# Patient Record
Sex: Male | Born: 1965 | Race: Black or African American | Hispanic: No | Marital: Single | Smoking: Current every day smoker
Health system: Southern US, Community
[De-identification: ages and names within clinical notes are randomized; demographics above are authoritative.]

## PROBLEM LIST (undated history)

## (undated) DIAGNOSIS — I639 Cerebral infarction, unspecified: Secondary | ICD-10-CM

## (undated) HISTORY — PX: APPENDECTOMY: SHX54

---

## 2014-10-23 ENCOUNTER — Encounter (HOSPITAL_BASED_OUTPATIENT_CLINIC_OR_DEPARTMENT_OTHER): Payer: Self-pay

## 2014-10-23 ENCOUNTER — Emergency Department (HOSPITAL_BASED_OUTPATIENT_CLINIC_OR_DEPARTMENT_OTHER): Payer: Self-pay

## 2014-10-23 ENCOUNTER — Emergency Department (HOSPITAL_BASED_OUTPATIENT_CLINIC_OR_DEPARTMENT_OTHER)
Admission: EM | Admit: 2014-10-23 | Discharge: 2014-10-23 | Disposition: A | Discharge status: Home / Self Care | Payer: Self-pay | Attending: Emergency Medicine | Admitting: Emergency Medicine

## 2014-10-23 DIAGNOSIS — M546 Pain in thoracic spine: Secondary | ICD-10-CM | POA: Insufficient documentation

## 2014-10-23 DIAGNOSIS — Z720 Tobacco use: Secondary | ICD-10-CM | POA: Insufficient documentation

## 2014-10-23 DIAGNOSIS — H538 Other visual disturbances: Secondary | ICD-10-CM | POA: Insufficient documentation

## 2014-10-23 LAB — COMPREHENSIVE METABOLIC PANEL
ALK PHOS: 76 U/L (ref 39–117)
ALT: 15 U/L (ref 0–53)
ANION GAP: 10 (ref 5–15)
AST: 21 U/L (ref 0–37)
Albumin: 3.8 g/dL (ref 3.5–5.2)
BUN: 13 mg/dL (ref 6–23)
CO2: 27 meq/L (ref 19–32)
Calcium: 9.6 mg/dL (ref 8.4–10.5)
Chloride: 105 mEq/L (ref 96–112)
Creatinine, Ser: 0.9 mg/dL (ref 0.50–1.35)
GLUCOSE: 82 mg/dL (ref 70–99)
POTASSIUM: 4.9 meq/L (ref 3.7–5.3)
SODIUM: 142 meq/L (ref 137–147)
TOTAL PROTEIN: 8.1 g/dL (ref 6.0–8.3)
Total Bilirubin: 0.7 mg/dL (ref 0.3–1.2)

## 2014-10-23 LAB — CBC WITH DIFFERENTIAL/PLATELET
BASOS PCT: 1 % (ref 0–1)
Basophils Absolute: 0.1 10*3/uL (ref 0.0–0.1)
Eosinophils Absolute: 0.6 10*3/uL (ref 0.0–0.7)
Eosinophils Relative: 7 % — ABNORMAL HIGH (ref 0–5)
HCT: 42.8 % (ref 39.0–52.0)
HEMOGLOBIN: 14.6 g/dL (ref 13.0–17.0)
Lymphocytes Relative: 16 % (ref 12–46)
Lymphs Abs: 1.3 10*3/uL (ref 0.7–4.0)
MCH: 31.3 pg (ref 26.0–34.0)
MCHC: 34.1 g/dL (ref 30.0–36.0)
MCV: 91.6 fL (ref 78.0–100.0)
MONOS PCT: 9 % (ref 3–12)
Monocytes Absolute: 0.7 10*3/uL (ref 0.1–1.0)
NEUTROS PCT: 67 % (ref 43–77)
Neutro Abs: 5.8 10*3/uL (ref 1.7–7.7)
PLATELETS: 199 10*3/uL (ref 150–400)
RBC: 4.67 MIL/uL (ref 4.22–5.81)
RDW: 13 % (ref 11.5–15.5)
WBC: 8.6 10*3/uL (ref 4.0–10.5)

## 2014-10-23 NOTE — ED Provider Notes (Signed)
CSN: [id number]     Arrival date & time 10/23/14  0736 History   First MD Initiated Contact with Patient 10/23/14 0755     Chief Complaint  Patient presents with  . Blurred Vision     (Consider location/radiation/quality/duration/timing/severity/associated sxs/prior Treatment) HPI Comments: Patient is a 48 year old male with no significant past medical history. He presents with complaints of a 10 day history of intermittent blurry vision in his right eye. He denies any injury or trauma. He denies any aggravating or alleviating factors. He states that this comes and goes at random. When he experiences this blurry vision, this is sometimes associated with pains in his left shoulder that radiated down his left arm and occasionally in his left leg. He denies any low back pain and denies any neck pain. 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  The history is provided by the patient.    History reviewed. No pertinent past medical history. Past Surgical History  Procedure Laterality Date  . Appendectomy     History reviewed. No pertinent family history. History  Substance Use Topics  . Smoking status: Current Every Day Smoker -- 0.50 packs/day    Types: Cigarettes  . Smokeless tobacco: Never Used  . Alcohol Use: 4.2 oz/week    7 Cans of beer per week     Comment: Pt stated that he drinks a beer per day    Review of Systems  All other systems reviewed and are negative.     Allergies  Review of patient's allergies indicates no known allergies.  Home Medications   Prior to Admission medications   Not on File   BP 112/69 mmHg  Pulse 74  Temp(Src) 98 F (36.7 C) (Oral)  Resp 20  Ht 5\' 11"  (1.803 m)  Wt 145 lb (65.772 kg)  BMI 20.23 kg/m2  SpO2 100% Physical Exam  Constitutional: He is oriented to person, place, and time. He appears well-developed and well-nourished. No distress.  HENT:  Head: Normocephalic and atraumatic.  Eyes: EOM are normal.  Pupils are equal, round, and reactive to light.  The right eye appears grossly normal. The cornea is clear and anterior chambers clear as well. Funduscopic exam reveals no papilledema.  Neck: Normal range of motion. Neck supple.  Cardiovascular: Normal rate, regular rhythm and normal heart sounds.   No murmur heard. Pulmonary/Chest: Effort normal and breath sounds normal. No respiratory distress. He has no wheezes.  Abdominal: Soft. Bowel sounds are normal. He exhibits no distension. There is no tenderness.  Musculoskeletal: Normal range of motion. He exhibits no edema.  Neurological: He is alert and oriented to person, place, and time. No cranial nerve deficit. He exhibits normal muscle tone. Coordination normal.  Skin: Skin is warm and dry. He is not diaphoretic.  Nursing note and vitals reviewed.   ED Course  Procedures (including critical care time) Labs Review Labs Reviewed  COMPREHENSIVE METABOLIC PANEL  CBC WITH DIFFERENTIAL    Imaging Review No results found.   EKG Interpretation None      MDM   Final diagnoses:  None    Patient is a 48 year old male who presents with complaints of blurry vision in his right eye and pain in his left shoulder and back. These appear unrelated. His eye exam is unremarkable and CT scan of the head does not reveal any intracranial pathology. His visual acuity is diminished in both eyes, but the right eye to a greater degree. I feel as though he requires  follow-up with optometry to have his eyes examined. As far as the pain in his back goes, nothing appears emergent. There are no neurologic deficits and no bowel or bladder complaints. I feel as though he is appropriate for discharge.    Geoffery Lyonsouglas Melo Stauber, MD 10/23/14 [phone]

## 2014-10-23 NOTE — ED Notes (Signed)
Pt presented to the ED with blurred vision in the right eye. Pt stated that it started 10 days ago and that he had been treating it with eye drops and thought it was allergies. Pt stated also complaining of left side pain underneath his arm pit and some back pain. Pt stated that he has sharp pains run down the back of his left leg at times.

## 2014-10-23 NOTE — Discharge Instructions (Signed)
Ibuprofen 600 mg every 6 hours as needed for pain.  Follow-up with optometry for an eye exam.  Return to the emergency department if your symptoms substantially worsen or change.   Back Pain, Adult Low back pain is very common. About 1 in 5 people have back pain.The cause of low back pain is rarely dangerous. The pain often gets better over time.About half of people with a sudden onset of back pain feel better in just 2 weeks. About 8 in 10 people feel better by 6 weeks.  CAUSES Some common causes of back pain include:  Strain of the muscles or ligaments supporting the spine.  Wear and tear (degeneration) of the spinal discs.  Arthritis.  Direct injury to the back. DIAGNOSIS Most of the time, the direct cause of low back pain is not known.However, back pain can be treated effectively even when the exact cause of the pain is unknown.Answering your caregiver's questions about your overall health and symptoms is one of the most accurate ways to make sure the cause of your pain is not dangerous. If your caregiver needs more information, he or she may order lab work or imaging tests (X-rays or MRIs).However, even if imaging tests show changes in your back, this usually does not require surgery. HOME CARE INSTRUCTIONS For many people, back pain returns.Since low back pain is rarely dangerous, it is often a condition that people can learn to [facility] Districtmanageon their own.   Remain active. It is stressful on the back to sit or stand in one place. Do not sit, drive, or stand in one place for more than 30 minutes at a time. Take short walks on level surfaces as soon as pain allows.Try to increase the length of time you walk each day.  Do not stay in bed.Resting more than 1 or 2 days can delay your recovery.  Do not avoid exercise or work.Your body is made to move.It is not dangerous to be active, even though your back may hurt.Your back will likely heal faster if you return to being active before  your pain is gone.  Pay attention to your body when you bend and lift. Many people have less discomfortwhen lifting if they bend their knees, keep the load close to their bodies,and avoid twisting. Often, the most comfortable positions are those that put less stress on your recovering back.  Find a comfortable position to sleep. Use a firm mattress and lie on your side with your knees slightly bent. If you lie on your back, put a pillow under your knees.  Only take over-the-counter or prescription medicines as directed by your caregiver. Over-the-counter medicines to reduce pain and inflammation are often the most helpful.Your caregiver may prescribe muscle relaxant drugs.These medicines help dull your pain so you can more quickly return to your normal activities and healthy exercise.  Put ice on the injured area.  Put ice in a plastic bag.  Place a towel between your skin and the bag.  Leave the ice on for 15-20 minutes, 03-04 times a day for the first 2 to 3 days. After that, ice and heat may be alternated to reduce pain and spasms.  Ask your caregiver about trying back exercises and gentle massage. This may be of some benefit.  Avoid feeling anxious or stressed.Stress increases muscle tension and can worsen back pain.It is important to recognize when you are anxious or stressed and learn ways to manage it.Exercise is a great option. SEEK MEDICAL CARE IF:  You have  pain that is not relieved with rest or medicine.  You have pain that does not improve in 1 week.  You have new symptoms.  You are generally not feeling well. SEEK IMMEDIATE MEDICAL CARE IF:   You have pain that radiates from your back into your legs.  You develop new bowel or bladder control problems.  You have unusual weakness or numbness in your arms or legs.  You develop nausea or vomiting.  You develop abdominal pain.  You feel faint. Document Released: 12/07/2005 Document Revised: 06/07/2012 Document  Reviewed: 04/10/2014 [facility] Patient Information 2015 [place], [place]. This information is not intended to replace advice given to you by your health care provider. Make sure you discuss any questions you have with your health care provider.  Visual Disturbances You have had a disturbance in your vision. This may be caused by various conditions, such as:  Migraines. Migraine headaches are often preceded by a disturbance in vision. Blind spots or light flashes are followed by a headache. This type of visual disturbance is temporary. It does not damage the eye.  Glaucoma. This is caused by increased pressure in the eye. Symptoms include haziness, blurred vision, or seeing rainbow colored circles when looking at bright lights. Partial or complete visual loss can occur. You may or may not experience eye pain. Visual loss may be gradual or sudden and is irreversible. Glaucoma is the leading cause of blindness.  Retina problems. Vision will be reduced if the retina becomes detached or if there is a circulation problem as with diabetes, high blood pressure, or a mini-stroke. Symptoms include seeing "floaters," flashes of light, or shadows, as if a curtain has fallen over your eye.  Optic nerve problems. The main nerve in your eye can be damaged by redness, soreness, and swelling (inflammation), poor circulation, drugs, and toxins. It is very important to have a complete exam done by a specialist to determine the exact cause of your eye problem. The specialist may recommend medicines or surgery, depending on the cause of the problem. This can help prevent further loss of vision or reduce the risk of having a stroke. Contact the caregiver to whom you have been referred and arrange for follow-up care right away. SEEK IMMEDIATE MEDICAL CARE IF:   Your vision gets worse.  You develop severe headaches.  You have any weakness or numbness in the face, arms, or legs.  You have any trouble speaking or  walking. Document Released: 01/14/2005 Document Revised: 02/29/2012 Document Reviewed: 05/07/2010 [facility] OrleansExitCare Patient Information 2015 [place], [place]. This information is not intended to replace advice given to you by your health care provider. Make sure you discuss any questions you have with your health care provider.

## 2014-10-23 NOTE — ED Notes (Signed)
MD at bedside. 

## 2014-10-23 NOTE — ED Notes (Signed)
Patient denies pain and is resting comfortably.  

## 2016-03-29 ENCOUNTER — Emergency Department (HOSPITAL_BASED_OUTPATIENT_CLINIC_OR_DEPARTMENT_OTHER): Payer: Self-pay

## 2016-03-29 ENCOUNTER — Encounter (HOSPITAL_BASED_OUTPATIENT_CLINIC_OR_DEPARTMENT_OTHER): Payer: Self-pay | Admitting: Emergency Medicine

## 2016-03-29 ENCOUNTER — Inpatient Hospital Stay (HOSPITAL_COMMUNITY): Payer: Self-pay

## 2016-03-29 ENCOUNTER — Observation Stay (HOSPITAL_BASED_OUTPATIENT_CLINIC_OR_DEPARTMENT_OTHER)
Admission: EM | Admit: 2016-03-29 | Discharge: 2016-03-31 | Disposition: A | Discharge status: Home / Self Care | Payer: Self-pay | Attending: Internal Medicine | Admitting: Internal Medicine

## 2016-03-29 DIAGNOSIS — G459 Transient cerebral ischemic attack, unspecified: Secondary | ICD-10-CM

## 2016-03-29 DIAGNOSIS — F17200 Nicotine dependence, unspecified, uncomplicated: Secondary | ICD-10-CM | POA: Insufficient documentation

## 2016-03-29 DIAGNOSIS — F449 Dissociative and conversion disorder, unspecified: Principal | ICD-10-CM | POA: Insufficient documentation

## 2016-03-29 DIAGNOSIS — F1210 Cannabis abuse, uncomplicated: Secondary | ICD-10-CM | POA: Insufficient documentation

## 2016-03-29 DIAGNOSIS — R519 Headache, unspecified: Secondary | ICD-10-CM

## 2016-03-29 DIAGNOSIS — I639 Cerebral infarction, unspecified: Secondary | ICD-10-CM | POA: Insufficient documentation

## 2016-03-29 DIAGNOSIS — R531 Weakness: Secondary | ICD-10-CM

## 2016-03-29 DIAGNOSIS — R51 Headache: Secondary | ICD-10-CM | POA: Insufficient documentation

## 2016-03-29 DIAGNOSIS — R200 Anesthesia of skin: Secondary | ICD-10-CM | POA: Insufficient documentation

## 2016-03-29 DIAGNOSIS — F17210 Nicotine dependence, cigarettes, uncomplicated: Secondary | ICD-10-CM | POA: Insufficient documentation

## 2016-03-29 DIAGNOSIS — F419 Anxiety disorder, unspecified: Secondary | ICD-10-CM | POA: Insufficient documentation

## 2016-03-29 LAB — COMPREHENSIVE METABOLIC PANEL
ALT: 18 U/L (ref 17–63)
ANION GAP: 6 (ref 5–15)
AST: 24 U/L (ref 15–41)
Albumin: 4.1 g/dL (ref 3.5–5.0)
Alkaline Phosphatase: 59 U/L (ref 38–126)
BUN: 14 mg/dL (ref 6–20)
CALCIUM: 9.2 mg/dL (ref 8.9–10.3)
CO2: 25 mmol/L (ref 22–32)
Chloride: 103 mmol/L (ref 101–111)
Creatinine, Ser: 1 mg/dL (ref 0.61–1.24)
GFR calc Af Amer: >60 mL/min (ref >60)
GFR calc non Af Amer: >60 mL/min (ref >60)
Glucose, Bld: 129 mg/dL — ABNORMAL HIGH (ref 65–99)
Potassium: 3.5 mmol/L (ref 3.5–5.1)
SODIUM: 134 mmol/L — AB (ref 135–145)
Total Bilirubin: 1.7 mg/dL — ABNORMAL HIGH (ref 0.3–1.2)
Total Protein: 7.8 g/dL (ref 6.5–8.1)

## 2016-03-29 LAB — RAPID URINE DRUG SCREEN, HOSP PERFORMED
AMPHETAMINES: NOT DETECTED
BARBITURATES: NOT DETECTED
Benzodiazepines: NOT DETECTED
COCAINE: NOT DETECTED
Opiates: NOT DETECTED
Tetrahydrocannabinol: POSITIVE — AB

## 2016-03-29 LAB — URINALYSIS, ROUTINE W REFLEX MICROSCOPIC
BILIRUBIN URINE: NEGATIVE
GLUCOSE, UA: NEGATIVE mg/dL
HGB URINE DIPSTICK: NEGATIVE
Ketones, ur: 15 mg/dL — AB
Leukocytes, UA: NEGATIVE
Nitrite: NEGATIVE
PROTEIN: NEGATIVE mg/dL
Specific Gravity, Urine: 1.022 (ref 1.005–1.030)
pH: 6 (ref 5.0–8.0)

## 2016-03-29 LAB — CBC
HCT: 40.6 % (ref 39.0–52.0)
HEMOGLOBIN: 14.1 g/dL (ref 13.0–17.0)
MCH: 31 pg (ref 26.0–34.0)
MCHC: 34.7 g/dL (ref 30.0–36.0)
MCV: 89.2 fL (ref 78.0–100.0)
PLATELETS: 197 10*3/uL (ref 150–400)
RBC: 4.55 MIL/uL (ref 4.22–5.81)
RDW: 13.2 % (ref 11.5–15.5)
WBC: 8.6 10*3/uL (ref 4.0–10.5)

## 2016-03-29 LAB — CBG MONITORING, ED: GLUCOSE-CAPILLARY: 149 mg/dL — AB (ref 65–99)

## 2016-03-29 LAB — DIFFERENTIAL
Basophils Absolute: 0.1 10*3/uL (ref 0.0–0.1)
Basophils Relative: 1 %
EOS PCT: 5 %
Eosinophils Absolute: 0.5 10*3/uL (ref 0.0–0.7)
LYMPHS PCT: 21 %
Lymphs Abs: 1.8 10*3/uL (ref 0.7–4.0)
Monocytes Absolute: 0.9 10*3/uL (ref 0.1–1.0)
Monocytes Relative: 11 %
NEUTROS PCT: 62 %
Neutro Abs: 5.4 10*3/uL (ref 1.7–7.7)

## 2016-03-29 LAB — PROTIME-INR
INR: 1.04 (ref 0.00–1.49)
Prothrombin Time: 13.8 seconds (ref 11.6–15.2)

## 2016-03-29 LAB — ETHANOL: Alcohol, Ethyl (B): <5 mg/dL (ref <5)

## 2016-03-29 LAB — APTT: APTT: 28 s (ref 24–37)

## 2016-03-29 LAB — TROPONIN I: Troponin I: <0.03 ng/mL (ref <0.031)

## 2016-03-29 MED ORDER — SODIUM CHLORIDE 0.9 % IV SOLN
1000.0000 mL | Freq: Once | INTRAVENOUS | Status: AC
  Administered 2016-03-29: 1000 mL via INTRAVENOUS

## 2016-03-29 MED ORDER — ROSUVASTATIN CALCIUM 40 MG PO TABS
40.0000 mg | ORAL_TABLET | Freq: Every day | ORAL | Status: DC
  Administered 2016-03-29 – 2016-03-30 (×2): 40 mg via ORAL
  Filled 2016-03-29 (×2): qty 1

## 2016-03-29 MED ORDER — SODIUM CHLORIDE 0.9 % IV BOLUS (SEPSIS)
1000.0000 mL | Freq: Once | INTRAVENOUS | Status: AC
Start: 2016-03-29 — End: 2016-03-29
  Administered 2016-03-29: 1000 mL via INTRAVENOUS

## 2016-03-29 MED ORDER — STROKE: EARLY STAGES OF RECOVERY BOOK
Freq: Once | Status: AC
  Administered 2016-03-30
  Filled 2016-03-29: qty 1

## 2016-03-29 MED ORDER — SODIUM CHLORIDE 0.9 % IV SOLN: 1000.0000 mL | INTRAVENOUS | Status: DC

## 2016-03-29 MED ORDER — ASPIRIN 81 MG PO CHEW
324.0000 mg | CHEWABLE_TABLET | Freq: Once | ORAL | Status: AC
  Administered 2016-03-29: 324 mg via ORAL
  Filled 2016-03-29: qty 4

## 2016-03-29 MED ORDER — SENNOSIDES-DOCUSATE SODIUM 8.6-50 MG PO TABS: 1.0000 | ORAL_TABLET | Freq: Every evening | ORAL | Status: DC | PRN

## 2016-03-29 MED ORDER — IOPAMIDOL (ISOVUE-370) INJECTION 76%
INTRAVENOUS | Status: AC
  Administered 2016-03-29: 50 mL
  Filled 2016-03-29: qty 50

## 2016-03-29 MED ORDER — SODIUM CHLORIDE 0.9 % IV SOLN
INTRAVENOUS | Status: AC
  Administered 2016-03-29: 21:00:00 via INTRAVENOUS

## 2016-03-29 NOTE — ED Notes (Signed)
Patient transported to CT 

## 2016-03-29 NOTE — ED Notes (Signed)
MD at bedside. 

## 2016-03-29 NOTE — Progress Notes (Signed)
[facility], alert and oriented with c/o of slight headache, pt settled in bed with call light at bedside, admitting doc paged and notified of pt's arrival, pt reassured, will however continue to monitor, v/s stable. Obasogie-Asidi, Dezyrae Kensinger Efe

## 2016-03-29 NOTE — ED Provider Notes (Signed)
CSN: [id number]     Arrival date & time 03/29/16  1716 History  By signing my name below, I, [facility]Marrissa Washington, attest that this documentation has been prepared under the direction and in the presence of Arby BarretteMarcy Cam Harnden, MD. Electronically Signed: Randell PatientMarrissa Washington, ED Scribe. 03/29/2016. 12:31 AM.   Chief Complaint  Patient presents with  . Numbness   The history is provided by the patient. No language interpreter was used.   HPI Comments: Georgiana SpinnerRobin L Roots is a 50 y.o. male who presents to the Emergency Department complaining of constant, gradually worsening numbness to his left arm, left leg, and left side of his face onset this morning upon waking. Patient reports numbness in the back and top of his head and in his left shoulder and forearm that began 11 hours ago. He states that he had difficulty standing but went back to sleep. Upon waking, 8.5 hours ago numbness had worsened with blurred vision in his left eye. He notes HA and speech slurring. Per patient, he has a family hx of stroke and had a brother who recently passed away from a massive stroke. He is a current 0.5 ppd cigarette smoker and consumes ETOH twice a week. Denies hx of HTN, DM. Denies recent illnesses, injuries, and falls. Denies fevers, chills.  History reviewed. No pertinent past medical history. Past Surgical History  Procedure Laterality Date  . Appendectomy     History reviewed. No pertinent family history. Social History  Substance Use Topics  . Smoking status: Current Every Day Smoker -- 0.50 packs/day    Types: Cigarettes  . Smokeless tobacco: Never Used  . Alcohol Use: 4.2 oz/week    7 Cans of beer per week     Comment: Pt stated that he drinks a beer per day    Review of Systems A complete 10 system review of systems was obtained and all systems are negative except as noted in the HPI and PMH.   Allergies  Review of patient's allergies indicates no known allergies.  Home Medications   Prior to  Admission medications   Not on File   BP 112/76 mmHg  Pulse 55  Temp(Src) 98.2 F (36.8 C) (Oral)  Resp 18  Ht 5\' 11"  (1.803 m)  Wt 143 lb 14.4 oz (65.273 kg)  BMI 20.08 kg/m2  SpO2 100% Physical Exam  Constitutional: He is oriented to person, place, and time. He appears well-developed and well-nourished. No distress.  HENT:  Head: Normocephalic and atraumatic.  Eyes: Conjunctivae and EOM are normal.  EOM intact. No visual field cuts.  Cardiovascular: Normal rate, regular rhythm and normal heart sounds.  Exam reveals no gallop and no friction rub.   No murmur heard. Pulmonary/Chest: Effort normal. No respiratory distress.  Abdominal: Soft.  Musculoskeletal: Normal range of motion.  No peripheral edema.  Neurological: He is alert and oriented to person, place, and time.  No cranial nerve deficit. 5/5 motor strength in BUE. No pronator drift. Endorses sensory differences to light touch, left arm vs right. Left lower extremity 4/5 strength. Right lower extremity 5/5 strength.  Skin: Skin is warm and dry.  Psychiatric: He has a normal mood and affect. His behavior is normal.    ED Course  Procedures   DIAGNOSTIC STUDIES: Oxygen Saturation is 98% on RA, normal by my interpretation.    COORDINATION OF CARE: 5:33 PM Will order labs, EKG, IV fluids, head CT. Discussed treatment plan with pt at bedside and pt agreed to plan.  6:32 PM  Discussed results of labs and CT imaging. Will consult with neurology. Will transfer pt to [facility] to be seen by neurologist and for further evaluation and testing.  Labs Review Labs Reviewed  COMPREHENSIVE METABOLIC PANEL - Abnormal; Notable for the following:    Sodium 134 (*)    Glucose, Bld 129 (*)    Total Bilirubin 1.7 (*)    All other components within normal limits  URINE RAPID DRUG SCREEN, HOSP PERFORMED - Abnormal; Notable for the following:    Tetrahydrocannabinol POSITIVE (*)    All other components within normal limits   URINALYSIS, ROUTINE W REFLEX MICROSCOPIC (NOT AT [facility]) - Abnormal; Notable for the following:    Ketones, ur 15 (*)    All other components within normal limits  CBG MONITORING, ED - Abnormal; Notable for the following:    Glucose-Capillary 149 (*)    All other components within normal limits  ETHANOL  PROTIME-INR  APTT  CBC  DIFFERENTIAL  TROPONIN I  HEMOGLOBIN A1C  COMPREHENSIVE METABOLIC PANEL  LIPID PANEL    Imaging Review  I have personally reviewed and evaluated these images and lab results as part of my medical decision-making.   EKG Interpretation   Date/Time:  Sunday March 29 2016 17:45:58 EDT Ventricular Rate:  75 PR Interval:  139 QRS Duration: 88 QT Interval:  377 QTC Calculation: 421 R Axis:   83 Text Interpretation:  Sinus rhythm RSR' in V1 or V2, probably normal  variant Probable anterolateral infarct, old ST elevation, consider  inferior injury diffuse ST elevation c/w early repolarizarion Confirmed by  Donnald Garre, MD, Lebron Conners [phone]) on 03/29/2016 6:36:43 PM     Consult:(18:45) Reviews neurology, Dr. Lavon Paganini. Advises to give aspirin and fluids. He has reviewed CT and feels there are signs of CVA on CT despite radiology review. Advises to admit the patient to hospitalist service and transferred to [facility] for ongoing stroke workup. MDM   Final diagnoses:  Acute CVA (cerebrovascular accident) (HCC)    I reviewed the patient's case with Dr. Lavon Paganini. He also feels a CT shows potential infarct type anomaly. Patient will be transferred to [facility] for ongoing stroke workup. Symptoms onset was upon awakening this morning and he is outside of any timeframe for TPA. He is otherwise alert and nontoxic. He does not have headache. At this timesuspicion for infectious etiology or other indolent process.  Arby Barrette, MD 03/30/16 [phone]

## 2016-03-29 NOTE — Progress Notes (Signed)
Patient is a 50 yo male with history of CVA who has been having worsening numbness in left arm since this morning. ER physician talked to Neuro and they wanted to admit for TIA/Stroke workup. Patient should get CTA and neurology contacted upon arrival.

## 2016-03-29 NOTE — H&P (Signed)
History and Physical  Matthew Thomas ZOX:[id number] DOB: 16-Jan-1966 DOA: 03/29/2016  PCP: No PCP Per Patient   Chief Complaint: left side numbness/weakness   History of Present Illness:  Patient is a 50 yo left handed male with no significant past medical history who came to the ED of an outside facility complaining of left side numbness that started earlier this morning around 8 am and progressed to his left face/head/leg then progressed to be associated with weakness in his left side that he was unable to get himself up : all within a few hours. He also had some difficulty expressing himself/finding words (expressive aphasia). Symptoms have improved only very mildly after he went to the ER and until now.   Review of Systems:  CONSTITUTIONAL:  No night sweats.  No fatigue, malaise, lethargy.  No fever or chills. Eyes:  No visual changes.  No eye pain.  No eye discharge.   ENT:    No epistaxis.  No sinus pain.  No sore throat.  No ear pain.  No congestion. RESPIRATORY:  No cough.  No wheeze.  No hemoptysis.  No shortness of breath. CARDIOVASCULAR:  No chest pains.  No palpitations. GASTROINTESTINAL:  No abdominal pain.  No nausea or vomiting.  No diarrhea or constipation.  No hematemesis.  No hematochezia.  No melena. GENITOURINARY:  No urgency.  No frequency.  No dysuria.  No hematuria.  No obstructive symptoms.  No discharge.  No pain.  No significant abnormal bleeding. MUSCULOSKELETAL:  No musculoskeletal pain.  No joint swelling.  No arthritis. NEUROLOGICAL:  No confusion.  +weakness. +headache. No seizure. PSYCHIATRIC:  No depression. No anxiety. No suicidal ideation. SKIN:  No rashes.  No lesions.  No wounds. ENDOCRINE:  No unexplained weight loss.  No polydipsia.  No polyuria.  No polyphagia. HEMATOLOGIC:  No anemia.  No purpura.  No petechiae.  No bleeding.  ALLERGIC AND IMMUNOLOGIC:  No pruritus.  No swelling Other:  Past Medical and Surgical History:   History reviewed.  No pertinent past medical history. Past Surgical History  Procedure Laterality Date  . Appendectomy      Social History:   reports that he has been smoking Cigarettes.  He has been smoking about 0.50 packs per day. He has never used smokeless tobacco. He reports that he drinks about 4.2 oz of alcohol per week. He reports that he uses illicit drugs (Marijuana).   No Known Allergies  FH: significant for massive stroke causing his brother death at age of 47 ( he had DM/HTN too), CHF in his mom.   Prior to Admission medications   Not on File    Physical Exam: BP 125/80 mmHg  Pulse 66  Temp(Src) 98.2 F (36.8 C) (Oral)  Resp 18  Ht  (1.803 m)  Wt 65.273 kg (143 lb 14.4 oz)  BMI 20.08 kg/m2  SpO2 97%  GENERAL : Well developed, well nourished, alert and cooperative, and appears to be in no acute distress. HEAD: normocephalic. EYES: PERRL, EOMI.  EARS:  hearing grossly intact. NOSE: No nasal discharge. THROAT: Oral cavity and pharynx normal.  NECK: Neck supple CARDIAC: Normal S1 and S2. No S3, S4 or murmurs. Rhythm is regular. There is no peripheral edema. LUNGS: Clear to auscultation ABDOMEN: Positive bowel sounds. Soft, nondistended, nontender.  EXTREMITIES: No significant deformity or joint abnormality.  NEUROLOGICAL: The mental examination revealed the patient was oriented to person, place, and time.CN II-XII intact except : numbness in left face, difficulty rotating head  to left side XI, no facial droop. Strength and sensation asymmetric: 5/5 in right side : 1/5 in left side : can't raise it up or move against resistance: can only move it parallel to a surface. Cerebellar testing normal in right hand; could not assess it in left side. Decreased sensation in left side.  SKIN: Skin normal color PSYCHIATRIC:  The patient was able to demonstrate good judgement and reason, without hallucinations, abnormal affect or abnormal behaviors during the examination. Patient is not  suicidal.          Labs on Admission:  Reviewed.   Radiological Exams on Admission: Ct Head Wo Contrast  03/29/2016  CLINICAL DATA:  Weakness and tingling EXAM: CT HEAD WITHOUT CONTRAST TECHNIQUE: Contiguous axial images were obtained from the base of the skull through the vertex without intravenous contrast. COMPARISON:  10/23/2014 FINDINGS: No acute cortical infarct, hemorrhage, or mass lesion ispresent. Ventricles are of normal size. No significant extra-axial fluid collection is present. Chronic mucoperiosteal thickening involving the maxillary sinuses noted. There is also mucosal thickening involving the ethmoid air cells and the frontal sinuses. The calvarium appears intact. The osseous skull is intact. IMPRESSION: 1. No acute intracranial abnormalities. 2. Chronic sinus disease. Electronically Signed   By: Signa Kellaylor  Stroud M.D.   On: 03/29/2016 18:06     Assessment/Plan  Stroke/TIA:  MRI: ordered   MRA :ordered   2D Echo : ordered   Telemetry for 24 hours post stroke/TIA.  Lipid profile in am  HgbA1c in am  UDS : pos for tetrahydrocannabinol  VTE prophylaxisSCDs awaiting neuro recs.   Antiplatelet therapy:  Got asp 324 mg in ER: awaiting further neuro recs.   Statin: crestor 40 mg ordered.    Neurochecks Q2H, PT/OT/Speech therapy, Neurology consult.   Bedside swallow evaluation: passed: will start on a cardiac diet. aspiration precautions.  ZO:[id number]:will be monitored with permissive elevation up to 220/110 Awaiting neuro eval.    DVT prophylaxis: SCD Consultants: Neuro Code Status: Full      Eston EstersAhmad Kaylor Simenson M.D [facility]

## 2016-03-29 NOTE — ED Notes (Signed)
C/C left arm numbness and weakness. Pt states he went to bed 11pm last night feeling fine. Woke up at 6am felt 'tingling in the brain' had difficulty with standing from left arm and left leg numbness. Symptoms continued, he went back to sleep. Woke up at 9am with increase in numbness to left arm/left leg and then developed blurred vision to left eye.

## 2016-03-29 NOTE — Consult Note (Signed)
Admission H&P    Chief Complaint: New onset numbness and speech changes.  HPI: Matthew Thomas is an 50 y.o. male who was seen initially at [facility] and subsequently transferred to [facility] for further evaluation. He developed acute onset of numbness involving left arm and shoulder with subsequent spread to left side of his face at about 8 AM this morning. He also had speech output difficulty. He has no previous history of stroke nor TIA. He has not been on antiplatelet therapy. CT scan of his head showed no acute intracranial abnormality. MRI of the brain also showed no acute intracranial abnormality. Speech difficulty has resolved. He still has residual left upper extremity and left facial numbness.  LSN: 8 AM on 03/29/2016 tPA Given: No: Minimal deficits and beyond time window for treatment consideration mRankin:  History reviewed. No pertinent past medical history.  Past Surgical History  Procedure Laterality Date  . Appendectomy      History reviewed. No pertinent family history. Social History:  reports that he has been smoking Cigarettes.  He has been smoking about 0.50 packs per day. He has never used smokeless tobacco. He reports that he drinks about 4.2 oz of alcohol per week. He reports that he uses illicit drugs (Marijuana).  Allergies: No Known Allergies  No prescriptions prior to admission    ROS: History obtained from the patient  General ROS: negative for - chills, fatigue, fever, night sweats, weight gain or weight loss Psychological ROS: negative for - behavioral disorder, hallucinations, memory difficulties, mood swings or suicidal ideation Ophthalmic ROS: negative for - blurry vision, double vision, eye pain or loss of vision ENT ROS: negative for - epistaxis, nasal discharge, oral lesions, sore throat, tinnitus or vertigo Allergy and Immunology ROS: negative for - hives or itchy/watery eyes Hematological and Lymphatic ROS: negative for - bleeding problems, bruising or  swollen lymph nodes Endocrine ROS: negative for - galactorrhea, hair pattern changes, polydipsia/polyuria or temperature intolerance Respiratory ROS: negative for - cough, hemoptysis, shortness of breath or wheezing Cardiovascular ROS: negative for - chest pain, dyspnea on exertion, edema or irregular heartbeat Gastrointestinal ROS: negative for - abdominal pain, diarrhea, hematemesis, nausea/vomiting or stool incontinence Genito-Urinary ROS: negative for - dysuria, hematuria, incontinence or urinary frequency/urgency Musculoskeletal ROS: negative for - joint swelling or muscular weakness Neurological ROS: as noted in HPI Dermatological ROS: negative for rash and skin lesion changes  Physical Examination: Blood pressure 123/73, pulse 64, temperature 98.2 F (36.8 C), temperature source Oral, resp. rate 18, height _0  (1.803 m), weight 65.273 kg (143 lb 14.4 oz), SpO2 98 %.  HEENT-  Normocephalic, no lesions, without obvious abnormality.  Normal external eye and conjunctiva.  Normal TM's bilaterally.  Normal auditory canals and external ears. Normal external nose, mucus membranes and septum.  Normal pharynx. Neck supple with no masses, nodes, nodules or enlargement. Cardiovascular - regular rate and rhythm, S1, S2 normal, no murmur, click, rub or gallop Lungs - chest clear, no wheezing, rales, normal symmetric air entry Abdomen - soft, non-tender; bowel sounds normal; no masses,  no organomegaly Extremities - no joint deformities, effusion, or inflammation and no edema  Neurologic Examination: Mental Status: Alert, oriented, thought content appropriate.  Speech fluent without evidence of aphasia. Able to follow commands without difficulty. Cranial Nerves: II-Visual fields were normal. III/IV/VI-Pupils were equal and reacted. Extraocular movements were full and conjugate.    V/VII-reduced perception of tactile sensation of left-sided face compared to the right in the colon no facial  weakness.  VIII-normal. X-normal speech and symmetrical palatal movement. XI: trapezius strength/neck flexion strength normal bilaterally XII-midline tongue extension with normal strength. Motor: 5/5 bilaterally with normal tone and bulk Sensory: Reduced perception of tactile sensation over left upper extremity compared to the right; sensory exam otherwise unremarkable. Deep Tendon Reflexes: 2+ and symmetric. Plantars: Flexor bilaterally Cerebellar: Normal finger-to-nose testing. Carotid auscultation: Normal  Results for orders placed or performed during the hospital encounter of 03/29/16 (from the past 48 hour(s))  Ethanol     Status: None   Collection Time: 03/29/16  5:40 PM  Result Value Ref Range   Alcohol, Ethyl (B) <5 <5 mg/dL    Comment:        LOWEST DETECTABLE LIMIT FOR SERUM ALCOHOL IS 5 mg/dL FOR MEDICAL PURPOSES ONLY   Protime-INR     Status: None   Collection Time: 03/29/16  5:40 PM  Result Value Ref Range   Prothrombin Time 13.8 11.6 - 15.2 seconds   INR 1.04 0.00 - 1.49  APTT     Status: None   Collection Time: 03/29/16  5:40 PM  Result Value Ref Range   aPTT 28 24 - 37 seconds  CBC     Status: None   Collection Time: 03/29/16  5:40 PM  Result Value Ref Range   WBC 8.6 4.0 - 10.5 K/uL   RBC 4.55 4.22 - 5.81 MIL/uL   Hemoglobin 14.1 13.0 - 17.0 g/dL   HCT 40.6 39.0 - 52.0 %   MCV 89.2 78.0 - 100.0 fL   MCH 31.0 26.0 - 34.0 pg   MCHC 34.7 30.0 - 36.0 g/dL   RDW 13.2 11.5 - 15.5 %   Platelets 197 150 - 400 K/uL  Differential     Status: None   Collection Time: 03/29/16  5:40 PM  Result Value Ref Range   Neutrophils Relative % 62 %   Neutro Abs 5.4 1.7 - 7.7 K/uL   Lymphocytes Relative 21 %   Lymphs Abs 1.8 0.7 - 4.0 K/uL   Monocytes Relative 11 %   Monocytes Absolute 0.9 0.1 - 1.0 K/uL   Eosinophils Relative 5 %   Eosinophils Absolute 0.5 0.0 - 0.7 K/uL   Basophils Relative 1 %   Basophils Absolute 0.1 0.0 - 0.1 K/uL  Comprehensive metabolic panel      Status: Abnormal   Collection Time: 03/29/16  5:40 PM  Result Value Ref Range   Sodium 134 (L) 135 - 145 mmol/L   Potassium 3.5 3.5 - 5.1 mmol/L   Chloride 103 101 - 111 mmol/L   CO2 25 22 - 32 mmol/L   Glucose, Bld 129 (H) 65 - 99 mg/dL   BUN 14 6 - 20 mg/dL   Creatinine, Ser 1.00 0.61 - 1.24 mg/dL   Calcium 9.2 8.9 - 10.3 mg/dL   Total Protein 7.8 6.5 - 8.1 g/dL   Albumin 4.1 3.5 - 5.0 g/dL   AST 24 15 - 41 U/L   ALT 18 17 - 63 U/L   Alkaline Phosphatase 59 38 - 126 U/L   Total Bilirubin 1.7 (H) 0.3 - 1.2 mg/dL   GFR calc non Af Amer >60 >60 mL/min   GFR calc Af Amer >60 >60 mL/min    Comment: (NOTE) The eGFR has been calculated using the CKD EPI equation. This calculation has not been validated in all clinical situations. eGFR's persistently <60 mL/min signify possible Chronic Kidney Disease.    Anion gap 6 5 - 15  Troponin I  Status: None   Collection Time: 03/29/16  5:40 PM  Result Value Ref Range   Troponin I <0.03 <0.031 ng/mL    Comment:        NO INDICATION OF MYOCARDIAL INJURY.   CBG monitoring, ED     Status: Abnormal   Collection Time: 03/29/16  5:45 PM  Result Value Ref Range   Glucose-Capillary 149 (H) 65 - 99 mg/dL  Urine rapid drug screen (hosp performed)not at [facility]     Status: Abnormal   Collection Time: 03/29/16  6:11 PM  Result Value Ref Range   Opiates NONE DETECTED NONE DETECTED   Cocaine NONE DETECTED NONE DETECTED   Benzodiazepines NONE DETECTED NONE DETECTED   Amphetamines NONE DETECTED NONE DETECTED   Tetrahydrocannabinol POSITIVE (A) NONE DETECTED   Barbiturates NONE DETECTED NONE DETECTED    Comment:        DRUG SCREEN FOR MEDICAL PURPOSES ONLY.  IF CONFIRMATION IS NEEDED FOR ANY PURPOSE, NOTIFY LAB WITHIN 5 DAYS.        LOWEST DETECTABLE LIMITS FOR URINE DRUG SCREEN Drug Class       Cutoff (ng/mL) Amphetamine      1000 Barbiturate      200 Benzodiazepine   096 Tricyclics       283 Opiates          300 Cocaine           300 THC              50   Urinalysis, Routine w reflex microscopic (not at [facility])     Status: Abnormal   Collection Time: 03/29/16  6:11 PM  Result Value Ref Range   Color, Urine YELLOW YELLOW   APPearance CLEAR CLEAR   Specific Gravity, Urine 1.022 1.005 - 1.030   pH 6.0 5.0 - 8.0   Glucose, UA NEGATIVE NEGATIVE mg/dL   Hgb urine dipstick NEGATIVE NEGATIVE   Bilirubin Urine NEGATIVE NEGATIVE   Ketones, ur 15 (A) NEGATIVE mg/dL   Protein, ur NEGATIVE NEGATIVE mg/dL   Nitrite NEGATIVE NEGATIVE   Leukocytes, UA NEGATIVE NEGATIVE    Comment: MICROSCOPIC NOT DONE ON URINES WITH NEGATIVE PROTEIN, BLOOD, LEUKOCYTES, NITRITE, OR GLUCOSE <1000 mg/dL.   Ct Head Wo Contrast  03/29/2016  CLINICAL DATA:  Weakness and tingling EXAM: CT HEAD WITHOUT CONTRAST TECHNIQUE: Contiguous axial images were obtained from the base of the skull through the vertex without intravenous contrast. COMPARISON:  10/23/2014 FINDINGS: No acute cortical infarct, hemorrhage, or mass lesion ispresent. Ventricles are of normal size. No significant extra-axial fluid collection is present. Chronic mucoperiosteal thickening involving the maxillary sinuses noted. There is also mucosal thickening involving the ethmoid air cells and the frontal sinuses. The calvarium appears intact. The osseous skull is intact. IMPRESSION: 1. No acute intracranial abnormalities. 2. Chronic sinus disease. Electronically Signed   By: Kerby Moors M.D.   On: 03/29/2016 18:06   Mr Brain Wo Contrast  03/29/2016  CLINICAL DATA:  LEFT-sided numbness beginning this morning, progressed to face and extremities, now with weakness. Expressive aphasia. Symptoms mildly improved. EXAM: MRI HEAD WITHOUT CONTRAST TECHNIQUE: Multiplanar, multiecho pulse sequences of the brain and surrounding structures were obtained without intravenous contrast. COMPARISON:  CT head March 29, 2016 at 1753 hours FINDINGS: The ventricles and sulci are normal for patient's age. No abnormal  parenchymal signal, mass lesions, mass effect. No reduced diffusion to suggest acute ischemia. No susceptibility artifact to suggest hemorrhage. No abnormal extra-axial fluid collections. No extra-axial masses though,  contrast enhanced sequences would be more sensitive. Normal major intracranial vascular flow voids seen at the skull base. Ocular globes and orbital contents are unremarkable though not tailored for evaluation. No abnormal sellar expansion. No suspicious calvarial bone marrow signal. Craniocervical junction maintained. Moderate paranasal sinus mucosal thickening without air-fluid levels. Trace LEFT mastoid effusion. IMPRESSION: Normal MRI brain. Electronically Signed   By: Elon Alas M.D.   On: 03/29/2016 22:40    Assessment: 50 y.o. male presenting with possible right subcortical MCA territory transient ischemic attack.  Stroke Risk Factors - family history and smoking  Plan: 1. HgbA1c, fasting lipid panel 2. MRA  of the brain without contrast 3. PT consult, OT consult, Speech consult 4. Echocardiogram 5. Carotid dopplers 6. Prophylactic therapy-Antiplatelet med: Aspirin  7. Risk factor modification 8. Telemetry monitoring 9. Hypercoagulopathy panel  C.R. Nicole Kindred, MD [facility] [phone]  03/29/2016, 11:17 PM

## 2016-03-30 ENCOUNTER — Encounter (HOSPITAL_COMMUNITY): Payer: Self-pay | Admitting: *Deleted

## 2016-03-30 DIAGNOSIS — F17200 Nicotine dependence, unspecified, uncomplicated: Secondary | ICD-10-CM

## 2016-03-30 DIAGNOSIS — R51 Headache: Secondary | ICD-10-CM

## 2016-03-30 DIAGNOSIS — F449 Dissociative and conversion disorder, unspecified: Secondary | ICD-10-CM | POA: Insufficient documentation

## 2016-03-30 DIAGNOSIS — F1210 Cannabis abuse, uncomplicated: Secondary | ICD-10-CM | POA: Insufficient documentation

## 2016-03-30 DIAGNOSIS — G459 Transient cerebral ischemic attack, unspecified: Secondary | ICD-10-CM | POA: Insufficient documentation

## 2016-03-30 DIAGNOSIS — R519 Headache, unspecified: Secondary | ICD-10-CM

## 2016-03-30 LAB — ANTITHROMBIN III: ANTITHROMB III FUNC: 69 % — AB (ref 75–120)

## 2016-03-30 LAB — COMPREHENSIVE METABOLIC PANEL
ALBUMIN: 2.8 g/dL — AB (ref 3.5–5.0)
ALK PHOS: 44 U/L (ref 38–126)
ALT: 13 U/L — AB (ref 17–63)
ANION GAP: 8 (ref 5–15)
AST: 17 U/L (ref 15–41)
BUN: 9 mg/dL (ref 6–20)
CALCIUM: 8.2 mg/dL — AB (ref 8.9–10.3)
CHLORIDE: 110 mmol/L (ref 101–111)
CO2: 21 mmol/L — AB (ref 22–32)
CREATININE: 0.83 mg/dL (ref 0.61–1.24)
GFR calc Af Amer: >60 mL/min (ref >60)
GFR calc non Af Amer: >60 mL/min (ref >60)
GLUCOSE: 86 mg/dL (ref 65–99)
Potassium: 3.7 mmol/L (ref 3.5–5.1)
SODIUM: 139 mmol/L (ref 135–145)
Total Bilirubin: 1.8 mg/dL — ABNORMAL HIGH (ref 0.3–1.2)
Total Protein: 5.6 g/dL — ABNORMAL LOW (ref 6.5–8.1)

## 2016-03-30 LAB — LIPID PANEL
Cholesterol: 113 mg/dL (ref 0–200)
HDL: 45 mg/dL (ref >40)
LDL CALC: 60 mg/dL (ref 0–99)
TRIGLYCERIDES: 40 mg/dL (ref <150)
Total CHOL/HDL Ratio: 2.5 RATIO
VLDL: 8 mg/dL (ref 0–40)

## 2016-03-30 MED ORDER — ASPIRIN 325 MG PO TABS
325.0000 mg | ORAL_TABLET | Freq: Every day | ORAL | Status: DC
  Administered 2016-03-30: 325 mg via ORAL
  Filled 2016-03-30: qty 1

## 2016-03-30 MED ORDER — ACETAMINOPHEN 325 MG PO TABS
650.0000 mg | ORAL_TABLET | Freq: Once | ORAL | Status: AC
  Administered 2016-03-30: 650 mg via ORAL
  Filled 2016-03-30: qty 2

## 2016-03-30 NOTE — Evaluation (Signed)
Speech Language Pathology Evaluation Patient Details Name: Georgiana SpinnerRobin L Kitzmiller MRN: [id number] DOB: 06/14/1966 Today's Date: 03/30/2016 Time: 1040-1100 SLP Time Calculation (min) (ACUTE ONLY): 20 min  Problem List:  Patient Active Problem List   Diagnosis Date Noted  . TIA (transient ischemic attack) 03/30/2016  . Headache 03/30/2016  . CVA (cerebral infarction) 03/29/2016  . Stroke [facility](HCC) 03/29/2016   Past Medical History: History reviewed. No pertinent past medical history. Past Surgical History:  Past Surgical History  Procedure Laterality Date  . Appendectomy     HPI:  Presented with Lt UE and facial weakness; speech difficulties; MRI negative, +THC on admission PMHx-brother died 2 weeks ago from CVA (age 48)    Assessment / Plan / Recommendation Clinical Impression  SLP administered the Cognistat. Pt demonstrated cognitive linguistic function WNL despite initial complaint of occasional word finding. Pt was fluent throughout assessment. No SLP f/u needed. Will sign off.     SLP Assessment       Follow Up Recommendations  None    Frequency and Duration           SLP Evaluation Prior Functioning  Cognitive/Linguistic Baseline: Within functional limits Type of Home: House Available Help at Discharge: Family;Available PRN/intermittently Vocation: Full time employment   Cognition  Overall Cognitive Status: Within Functional Limits for tasks assessed Orientation Level: Oriented X4    Comprehension  Auditory Comprehension Overall Auditory Comprehension: Appears within functional limits for tasks assessed    Expression Verbal Expression Overall Verbal Expression: Appears within functional limits for tasks assessed Written Expression Dominant Hand: Left   Oral / Motor  Motor Speech Overall Motor Speech: Appears within functional limits for tasks assessed   GO          Functional Assessment Tool Used: Cognistat Functional Limitations: Spoken language expressive Spoken  Language Expression Current Status (W0981(G9162): 0 percent impaired, limited or restricted Spoken Language Expression Goal Status (X9147(G9163): 0 percent impaired, limited or restricted Spoken Language Expression Discharge Status [phone](G9164): 0 percent impaired, limited or restricted         Gelene Recktenwald, Riley NearingBonnie Caroline 03/30/2016, 1:11 PM

## 2016-03-30 NOTE — Evaluation (Signed)
Occupational Therapy Evaluation Patient Details Name: Matthew Thomas MRN: [id number] DOB: 03-Jun-1966 Today's Date: 03/30/2016    History of Present Illness Admitted for Lt UE weakness and numbness and speech difficulties; MRI negative. PMHx- appendectomy and his brother died 2 weeks ago from CVA (age 48).    Clinical Impression   Pt admitted for above. Pt independent with ADLs, PTA. Feel pt will benefit from acute OT to increase independence and address LUE prior to d/c.   Follow Up Recommendations  Home health OT;Supervision - Intermittent    Equipment Recommendations  3 in 1 bedside comode    Recommendations for Other Services       Precautions / Restrictions Precautions Precautions: Fall Restrictions Weight Bearing Restrictions: No      Mobility Bed Mobility      General bed mobility comments: not assessed  Transfers Overall transfer level: Needs assistance Transfers: Sit to/from Stand Sit to Stand: Min guard           Balance Assist given for ambulation.                         ADL Overall ADL's : Needs assistance/impaired Eating/Feeding: Sitting;Set up                   Lower Body Dressing: Minimal assistance;Sit to/from stand   Toilet Transfer: Minimal assistance;Ambulation (sit to stand from chair)           Functional mobility during ADLs: Minimal assistance General ADL Comments: Encouraged pt to be using LUE and moving it.  Educated on safety such as sitting for LB ADLs.     Vision  Pt wears reading glasses; reports blurry vision   Perception     Praxis      Pertinent Vitals/Pain Pain Assessment: No/denies pain     Hand Dominance Left   Extremity/Trunk Assessment Upper Extremity Assessment Upper Extremity Assessment: LUE deficits/detail LUE Deficits / Details: little less than full AROM shoulder flexion; able to withstand some resistance when testing shoulder flexor strength; weaker grasp compared to Rt LUE  Sensation: decreased light touch LUE Coordination: decreased fine motor;decreased gross motor   Lower Extremity Assessment Lower Extremity Assessment: Defer to PT evaluation LLE Deficits / Details: in sitting--knee extension 2+ however when isometric resistance given able to incr to 4/5, ankle DF 2+, LLE Sensation: decreased light touch (pt reports decreased by 75% compared to Rt)     Communication Communication Communication: No difficulties   Cognition Arousal/Alertness: Awake/alert Behavior During Therapy: Flat affect Overall Cognitive Status: Within Functional Limits for tasks assessed                     General Comments       Exercises       Shoulder Instructions      Home Living Family/patient expects to be discharged to:: Private residence Living Arrangements: Spouse/significant other (fiance) Available Help at Discharge: Family;Available PRN/intermittently Type of Home: House Home Access: Stairs to enter [employer] of Steps: 4   Home Layout: One level     Bathroom Shower/Tub: Producer, television/film/video: Standard (Prest close)     Home Equipment: None          Prior Functioning/Environment Level of Independence: Independent        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: Other (comment) (Hemiparesis dominant side)   OT Problem List: Impaired sensation;Decreased knowledge of use of DME  or AE;Impaired balance (sitting and/or standing);Impaired vision/perception;Decreased coordination;Decreased strength   OT Treatment/Interventions: Self-care/ADL training;DME and/or AE instruction;Therapeutic activities;Visual/perceptual remediation/compensation;Patient/family education;Balance training;Therapeutic exercise    OT Goals(Current goals can be found in the care plan section) Acute Rehab OT Goals Patient Stated Goal: not stated OT Goal Formulation: With patient Time For Goal Achievement: 04/06/16 Potential to Achieve  Goals: Good ADL Goals Pt Will Perform Lower Body Dressing: with set-up;with supervision;sit to/from stand Pt Will Transfer to Toilet: with supervision;ambulating;regular height toilet;grab bars (and set up for RW) Pt Will Perform Toileting - Clothing Manipulation and hygiene: with supervision;sit to/from stand Pt Will Perform Tub/Shower Transfer: ambulating;Shower transfer;3 in 1;with supervision;with set-up Additional ADL Goal #1: Pt will independently perform HEP for LUE and use LUE functionally in session.  OT Frequency: Min 2X/week   Barriers to D/C:            Co-evaluation              End of Session Equipment Utilized During Treatment: Gait belt  Activity Tolerance: Patient tolerated treatment well Patient left: in chair;with call bell/phone within reach;with family/visitor present   Time: 1213-1226 OT Time Calculation (min): 13 min Charges:  OT General Charges $OT Visit: 1 Procedure OT Evaluation $OT Eval Low Complexity: 1 Procedure G-Codes: OT G-codes **NOT FOR INPATIENT CLASS** Functional Assessment Tool Used: clinical judgment Functional Limitation: Self care Self Care Current Status (W9604(G8987): At least 20 percent but less than 40 percent impaired, limited or restricted Self Care Goal Status (V4098(G8988): At least 1 percent but less than 20 percent impaired, limited or restricted  Earlie RavelingStraub, Jozie Wulf L OTR/L [phone] 03/30/2016, 1:18 PM

## 2016-03-30 NOTE — Care Management Note (Signed)
Case Management Note  Patient Details  Name: Matthew Thomas MRN: [id number] Date of Birth: 03/01/1966  Subjective/Objective:                    Action/Plan: Patient presented with TIA.  He is currently listed as self-pay and will be contacted by Financial Counseling. Will follow for discharge needs pending patient's progress and physician orders.  Expected Discharge Date:                  Expected Discharge Plan:     In-House Referral:     Discharge planning Services     Post Acute Care Choice:    Choice offered to:     DME Arranged:    DME Agency:     HH Arranged:    HH Agency:     Status of Service:  In process, will continue to follow  Medicare Important Message Given:    Date Medicare IM Given:    Medicare IM give by:    Date Additional Medicare IM Given:    Additional Medicare Important Message give by:     If discussed at Long Length of Stay Meetings, dates discussed:    Additional Comments:  Anda KraftRobarge, Persephanie Laatsch C, RN 03/30/2016, 11:20 AM [phone]

## 2016-03-30 NOTE — Evaluation (Signed)
Physical Therapy Evaluation Patient Details Name: Matthew Thomas MRN: [id number] DOB: 12/19/1966 Today's Date: 03/30/2016   History of Present Illness  Presented with Lt UE and facial weakness; speech difficulties; MRI negative, +THC on admission PMHx-brother died 2 weeks ago from CVA (age 48)    Clinical Impression  Pt admitted with above symptoms. Strength testing of LLE very inconsistent and does not match his ability in standing and walking (no knee buckling). Pt currently with functional limitations due to the deficits listed below (see PT Problem List). Pt will benefit from skilled PT to increase their independence and safety with mobility to allow discharge home.      Follow Up Recommendations Home health PT (although pt with no insurance and negative MRI; anticipate will not be able to afford); Supervision for mobility/OOB    Equipment Recommendations  Rolling walker with 5" wheels    Recommendations for Other Services OT consult     Precautions / Restrictions Precautions Precautions: Fall      Mobility  Bed Mobility Overal bed mobility: Modified Independent             General bed mobility comments: incr time and effort  Transfers Overall transfer level: Needs assistance Equipment used: None Transfers: Sit to/from Stand Sit to Stand: Min guard         General transfer comment: incr time and effort; weight primarily on RLE  Ambulation/Gait Ambulation/Gait assistance: Min assist Ambulation Distance (Feet): 24 Feet Assistive device: None Gait Pattern/deviations: Step-to pattern;Decreased stride length;Decreased dorsiflexion - left;Decreased weight shift to left Gait velocity: very slow Gait velocity interpretation: Below normal speed for age/gender General Gait Details: patient "favoring" LLE with very limited weightbearing/stance time; no knee buckling when advancing RLE  Stairs            Wheelchair Mobility    Modified Rankin (Stroke Patients  Only) Modified Rankin (Stroke Patients Only) Pre-Morbid Rankin Score: No symptoms Modified Rankin: Moderately severe disability     Balance Overall balance assessment: Needs assistance Sitting-balance support: No upper extremity supported;Feet supported Sitting balance-Leahy Scale: Good     Standing balance support: No upper extremity supported Standing balance-Leahy Scale: Fair                               Pertinent Vitals/Pain Pain Assessment: No/denies pain    Home Living Family/patient expects to be discharged to:: Private residence Living Arrangements: Spouse/significant other (fiance)   Type of Home: House Home Access: Stairs to enter   Secretary/administratorntrance Stairs-Number of Steps: 4 Home Layout: One level Home Equipment: None      Prior Function Level of Independence: Independent         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     Hand Dominance   Dominant Hand: Left    Extremity/Trunk Assessment   Upper Extremity Assessment: Defer to OT evaluation (+drift, weakness, decr sensation)           Lower Extremity Assessment: LLE deficits/detail (RLE WFL)   LLE Deficits / Details: in sitting--knee extension 2+ however when isometric resistance given able to incr to 4/5, ankle DF 2+,  Cervical / Trunk Assessment: Other exceptions  Communication   Communication: No difficulties  Cognition Arousal/Alertness: Awake/alert Behavior During Therapy: Flat affect Overall Cognitive Status: Within Functional Limits for tasks assessed                      General Comments  Exercises        Assessment/Plan    PT Assessment Patient needs continued PT services  PT Diagnosis Difficulty walking   PT Problem List Decreased strength;Decreased balance;Decreased mobility;Decreased knowledge of use of DME;Impaired sensation  PT Treatment Interventions DME instruction;Gait training;Stair training;Functional mobility training;Therapeutic activities;Balance  training;Neuromuscular re-education;Patient/family education   PT Goals (Current goals can be found in the Care Plan section) Acute Rehab PT Goals Patient Stated Goal: get bettter PT Goal Formulation: With patient Time For Goal Achievement: 04/03/16 Potential to Achieve Goals: Good    Frequency Min 4X/week   Barriers to discharge        Co-evaluation               End of Session Equipment Utilized During Treatment: Gait belt Activity Tolerance: Patient tolerated treatment well Patient left: in chair;with call bell/phone within reach;with family/visitor present;Other (comment) (with OT) Nurse Communication: Mobility status;Other (comment) (provide lots of encouragement)    Functional Assessment Tool Used: clinical judgement Functional Limitation: Mobility: Walking and moving around Mobility: Walking and Moving Around Current Status [phone]): At least 1 percent but less than 20 percent impaired, limited or restricted Mobility: Walking and Moving Around Goal Status [phone]): 0 percent impaired, limited or restricted    Time: 1138-1207 PT Time Calculation (min) (ACUTE ONLY): 29 min   Charges:   PT Evaluation $PT Eval Low Complexity: 1 Procedure PT Treatments $Gait Training: 8-22 mins   PT G Codes:   PT G-Codes **NOT FOR INPATIENT CLASS** Functional Assessment Tool Used: clinical judgement Functional Limitation: Mobility: Walking and moving around Mobility: Walking and Moving Around Current Status ([id number]): At least 1 percent but less than 20 percent impaired, limited or restricted Mobility: Walking and Moving Around Goal Status [phone]): 0 percent impaired, limited or restricted    Matthew Thomas 03/30/2016, 12:29 PM  Pager [phone]

## 2016-03-30 NOTE — Progress Notes (Signed)
STROKE TEAM PROGRESS NOTE   HISTORY OF PRESENT ILLNESS Georgiana SpinnerRobin L Vu is an 50 y.o. male who [facility]. He developed acute onset of numbness involving left arm and shoulder with subsequent spread to left side of his face at about 8 AM this morning (LKW 03/29/2016 at 8 AM). He also had speech output difficulty. He has no previous history of stroke nor TIA. He has not been on antiplatelet therapy. CT scan of his head showed no acute intracranial abnormality. MRI of the brain also showed no acute intracranial abnormality. Speech difficulty has resolved. He still has residual left upper extremity and left facial numbness. Patient was not administered IV t-PA secondary to Minimal deficits and beyond time window for treatment consideration. He was admitted for further evaluation and treatment.   SUBJECTIVE (INTERVAL HISTORY) Patient working with PT on arrival. Still difficulty walking. His 48 yo brother with cardiac disease recently died of a massive stroke and buried his mother 6 months ago from CHF. He feels a "little stress here and there" no medical problems.    OBJECTIVE Temp:  [98 F (36.7 C)-98.7 F (37.1 C)] 98 F (36.7 C) (04/10 1018) Pulse Rate:  [54-90] 54 (04/10 1018) Cardiac Rhythm:  [-] Normal sinus rhythm (04/10 0700) Resp:  [12-24] 18 (04/10 1018) BP: (105-130)/(64-94) 111/68 mmHg (04/10 1018) SpO2:  [95 %-100 %] 98 % (04/10 1018) Weight:  [64.501 kg (142 lb 3.2 oz)-65.273 kg (143 lb 14.4 oz)] 65.273 kg (143 lb 14.4 oz) (04/09 2054)  CBC:   Recent Labs Lab 03/29/16 1740  WBC 8.6  NEUTROABS 5.4  HGB 14.1  HCT 40.6  MCV 89.2  PLT 197    Basic Metabolic Panel:   Recent Labs Lab 03/29/16 1740 03/30/16 0454  NA 134* 139  K 3.5 3.7  CL 103 110  CO2 25 21*  GLUCOSE 129* 86  BUN 14 9  CREATININE 1.00 0.83  CALCIUM 9.2 8.2*    Lipid Panel:     Component Value Date/Time   CHOL 113  03/30/2016 0454   TRIG 40 03/30/2016 0454   HDL 45 03/30/2016 0454   CHOLHDL 2.5 03/30/2016 0454   VLDL 8 03/30/2016 0454   LDLCALC 60 03/30/2016 0454   HgbA1c: No results found for: HGBA1C Urine Drug Screen:     Component Value Date/Time   LABOPIA NONE DETECTED 03/29/2016 1811   COCAINSCRNUR NONE DETECTED 03/29/2016 1811   LABBENZ NONE DETECTED 03/29/2016 1811   AMPHETMU NONE DETECTED 03/29/2016 1811   THCU POSITIVE* 03/29/2016 1811   LABBARB NONE DETECTED 03/29/2016 1811      IMAGING I have personally reviewed the radiological images below and agree with the radiology interpretations.  Ct Head Wo Contrast 03/29/2016   1. No acute intracranial abnormalities. 2. Chronic sinus disease.   Ct Angio Head & Neck W/cm &/or Wo/cm 03/29/2016   Normal CTA head and neck.   Mr Brain Wo Contrast 03/29/2016  Normal MRI brain.   2D echo - pending   PHYSICAL EXAM  Temp:  [98 F (36.7 C)-98.3 F (36.8 C)] 98.3 F (36.8 C) (04/10 1820) Pulse Rate:  [54-75] 57 (04/10 1820) Resp:  [15-24] 20 (04/10 1820) BP: (105-125)/(66-80) 113/66 mmHg (04/10 1820) SpO2:  [95 %-100 %] 98 % (04/10 1820) Weight:  [143 lb 14.4 oz (65.273 kg)] 143 lb 14.4 oz (65.273 kg) (04/09 2054)  General - Well nourished, well developed, in no apparent distress.  Ophthalmologic -  Fundi not visualized due to eye movement.  Cardiovascular - Regular rate and rhythm.  Mental Status -  Level of arousal and orientation to time, place, and person were intact. Language including expression, naming, repetition, comprehension was assessed and found intact. Fund of Knowledge was assessed and was intact.  Cranial Nerves II - XII - II - Visual field intact OU. III, IV, VI - Extraocular movements intact. V - Facial sensation intact bilaterally. VII - Facial movement intact bilaterally. VIII - Hearing & vestibular intact bilaterally. X - Palate elevates symmetrically. XI - Chin turning & shoulder shrug intact  bilaterally. XII - Tongue protrusion intact.  Motor Strength - The patient's strength was normal in RUE and RLE, but significant giveaway weakness at LLE and no pronator drift with distraction on LUE.  Bulk was normal and fasciculations were absent.   Motor Tone - Muscle tone was assessed at the neck and appendages and was normal.  Reflexes - The patient's reflexes were 1+ in all extremities and he had no pathological reflexes.  Sensory - Light touch, temperature/pinprick were assessed and were symmetrical.    Coordination - The patient had normal movements in the hand with no ataxia or dysmetria, but slow on the left UE.  Tremor was absent.  Gait and Station - deferred to PT in room.   ASSESSMENT/PLAN Mr. REVANTH NEIDIG is a 50 y.o. male with no significant past medical history presenting with new onset left arm and face numbness with speech changes. He did not receive IV t-PA due to mnimal deficits and beyond time window for treatment. Consideration.   Conversion disorder  MRI  Normal  CTA head and neck normal  2D Echo pending   LDL 60 - no statin needed  HgbA1c pending  SCDs for VTE prophylaxis  No hypercoagulable work up needed Diet Heart Room service appropriate?: Yes; Fluid consistency:: Thin  No antithrombotic prior to admission, now on aspirin 325 mg daily. However, no ASA needed at this time.  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 Please call with questions. No neuro follow up needed. Thanks for the consult.  Marvel Plan, MD PhD Stroke  Neurology 03/30/2016 6:43 PM     To contact Stroke Continuity provider, please refer to [email or web address]. After hours, contact General Neurology

## 2016-03-30 NOTE — Progress Notes (Signed)
PROGRESS NOTE  Matthew Thomas:[id number] DOB: 1966-07-23 DOA: 03/29/2016 PCP: No PCP Per Patient  Assessment/Plan: TIA:  Brother recently died of CVA MRI: normal  CTA: normal 2D Echo : ordered  Telemetry  Lipid profile: LDL 60, HDL 45 HgbA1c pending UDS : pos for tetrahydrocannabinol Per neuro: hypercoag panel ASA Bedside swallow evaluation: passed: will start on a cardiac diet. aspiration precautions. PT/OT eval  Code Status: full Family Communication: patient Disposition Plan:    Consultants:  neuro  Procedures:      HPI/Subjective: C/o blurry vision Had intense pain behind left eye last PM Still with numbness  Objective: Filed Vitals:   03/30/16 0600 03/30/16 0753  BP: 105/75 115/79  Pulse: 64 62  Temp: 98.2 F (36.8 C) 98.1 F (36.7 C)  Resp: 18 18    Intake/Output Summary (Last 24 hours) at 03/30/16 0914 Last data filed at 03/30/16 0600  Gross per 24 hour  Intake   1240 ml  Output    650 ml  Net    590 ml   Filed Weights   03/29/16 1725 03/29/16 2054  Weight: 64.501 kg (142 lb 3.2 oz) 65.273 kg (143 lb 14.4 oz)    Exam:   General:  Awake, NAD  Cardiovascular: rrr  Respiratory: clear  Abdomen: +BS, soft  Musculoskeletal: no edema  Data Reviewed: Basic Metabolic Panel:  Recent Labs Lab 03/29/16 1740 03/30/16 0454  NA 134* 139  K 3.5 3.7  CL 103 110  CO2 25 21*  GLUCOSE 129* 86  BUN 14 9  CREATININE 1.00 0.83  CALCIUM 9.2 8.2*   Liver Function Tests:  Recent Labs Lab 03/29/16 1740 03/30/16 0454  AST 24 17  ALT 18 13*  ALKPHOS 59 44  BILITOT 1.7* 1.8*  PROT 7.8 5.6*  ALBUMIN 4.1 2.8*   No results for input(s): LIPASE, AMYLASE in the last 168 hours. No results for input(s): AMMONIA in the last 168 hours. CBC:  Recent Labs Lab 03/29/16 1740  WBC 8.6  NEUTROABS 5.4  HGB 14.1  HCT 40.6  MCV 89.2  PLT 197   Cardiac Enzymes:  Recent Labs Lab 03/29/16 1740  TROPONINI <0.03   BNP (last 3  results) No results for input(s): BNP in the last 8760 hours.  ProBNP (last 3 results) No results for input(s): PROBNP in the last 8760 hours.  CBG:  Recent Labs Lab 03/29/16 1745  GLUCAP 149*    No results found for this or any previous visit (from the past 240 hour(s)).   Studies: Ct Angio Head W/cm &/or Wo Cm  03/29/2016  CLINICAL DATA:  LEFT-sided weakness and facial numbness. Assess for stroke. EXAM: CT ANGIOGRAPHY HEAD AND NECK TECHNIQUE: Multidetector CT imaging of the head and neck was performed using the standard protocol during bolus administration of intravenous contrast. Multiplanar CT image reconstructions and MIPs were obtained to evaluate the vascular anatomy. Carotid stenosis measurements (when applicable) are obtained utilizing NASCET criteria, using the distal internal carotid diameter as the denominator. CONTRAST:  50 cc Isovue 370 COMPARISON:  MRI of the head April 9th 2017 at 2210 hours FINDINGS: CTA NECK Aortic arch: Normal appearance of the thoracic arch, normal branch pattern. The origins of the innominate, left Common carotid artery and subclavian artery are widely patent. Right carotid system: Common carotid artery is widely patent, coursing in a straight line fashion. Normal appearance of the carotid bifurcation without hemodynamically significant stenosis by NASCET criteria. Normal appearance of the included internal carotid artery. Left carotid  system: Common carotid artery is widely patent, coursing in a straight line fashion. Normal appearance of the carotid bifurcation without hemodynamically significant stenosis by NASCET criteria. Normal appearance of the included internal carotid artery. Vertebral arteries:Left vertebral artery is dominant. Normal appearance of the vertebral arteries, which appear widely patent. Skeleton: No acute osseous process though bone windows have not been submitted. Other neck: Soft tissues of the neck are non-acute though, not tailored for  evaluation. Apices demonstrate bullous changes. Multiple absent teeth and scattered dental caries. CTA HEAD Anterior circulation: Normal appearance of the cervical internal carotid arteries, petrous, cavernous and supra clinoid internal carotid arteries. Widely patent anterior communicating artery. Normal appearance of the anterior and middle cerebral arteries. Posterior circulation: Normal appearance of the vertebral arteries, vertebrobasilar junction and basilar artery, as well as main branch vessels. Normal appearance of the posterior cerebral arteries. Robust contribution by bilateral posterior communicating arteries. No large vessel occlusion, hemodynamically significant stenosis, dissection, luminal irregularity, contrast extravasation or aneurysm within the anterior nor posterior circulation. No abnormal intracranial enhancement. IMPRESSION: Normal CTA head and neck. Electronically Signed   By: Awilda Metro M.D.   On: 03/29/2016 23:56   Ct Head Wo Contrast  03/29/2016  CLINICAL DATA:  Weakness and tingling EXAM: CT HEAD WITHOUT CONTRAST TECHNIQUE: Contiguous axial images were obtained from the base of the skull through the vertex without intravenous contrast. COMPARISON:  10/23/2014 FINDINGS: No acute cortical infarct, hemorrhage, or mass lesion ispresent. Ventricles are of normal size. No significant extra-axial fluid collection is present. Chronic mucoperiosteal thickening involving the maxillary sinuses noted. There is also mucosal thickening involving the ethmoid air cells and the frontal sinuses. The calvarium appears intact. The osseous skull is intact. IMPRESSION: 1. No acute intracranial abnormalities. 2. Chronic sinus disease. Electronically Signed   By: Signa Kell M.D.   On: 03/29/2016 18:06   Ct Angio Neck W/cm &/or Wo/cm  03/29/2016  CLINICAL DATA:  LEFT-sided weakness and facial numbness. Assess for stroke. EXAM: CT ANGIOGRAPHY HEAD AND NECK TECHNIQUE: Multidetector CT imaging of the  head and neck was performed using the standard protocol during bolus administration of intravenous contrast. Multiplanar CT image reconstructions and MIPs were obtained to evaluate the vascular anatomy. Carotid stenosis measurements (when applicable) are obtained utilizing NASCET criteria, using the distal internal carotid diameter as the denominator. CONTRAST:  50 cc Isovue 370 COMPARISON:  MRI of the head April 9th 2017 at 2210 hours FINDINGS: CTA NECK Aortic arch: Normal appearance of the thoracic arch, normal branch pattern. The origins of the innominate, left Common carotid artery and subclavian artery are widely patent. Right carotid system: Common carotid artery is widely patent, coursing in a straight line fashion. Normal appearance of the carotid bifurcation without hemodynamically significant stenosis by NASCET criteria. Normal appearance of the included internal carotid artery. Left carotid system: Common carotid artery is widely patent, coursing in a straight line fashion. Normal appearance of the carotid bifurcation without hemodynamically significant stenosis by NASCET criteria. Normal appearance of the included internal carotid artery. Vertebral arteries:Left vertebral artery is dominant. Normal appearance of the vertebral arteries, which appear widely patent. Skeleton: No acute osseous process though bone windows have not been submitted. Other neck: Soft tissues of the neck are non-acute though, not tailored for evaluation. Apices demonstrate bullous changes. Multiple absent teeth and scattered dental caries. CTA HEAD Anterior circulation: Normal appearance of the cervical internal carotid arteries, petrous, cavernous and supra clinoid internal carotid arteries. Widely patent anterior communicating artery. Normal appearance of the  anterior and middle cerebral arteries. Posterior circulation: Normal appearance of the vertebral arteries, vertebrobasilar junction and basilar artery, as well as main  branch vessels. Normal appearance of the posterior cerebral arteries. Robust contribution by bilateral posterior communicating arteries. No large vessel occlusion, hemodynamically significant stenosis, dissection, luminal irregularity, contrast extravasation or aneurysm within the anterior nor posterior circulation. No abnormal intracranial enhancement. IMPRESSION: Normal CTA head and neck. Electronically Signed   By: Awilda Metroourtnay  Bloomer M.D.   On: 03/29/2016 23:56   Mr Brain Wo Contrast  03/29/2016  CLINICAL DATA:  LEFT-sided numbness beginning this morning, progressed to face and extremities, now with weakness. Expressive aphasia. Symptoms mildly improved. EXAM: MRI HEAD WITHOUT CONTRAST TECHNIQUE: Multiplanar, multiecho pulse sequences of the brain and surrounding structures were obtained without intravenous contrast. COMPARISON:  CT head March 29, 2016 at 1753 hours FINDINGS: The ventricles and sulci are normal for patient's age. No abnormal parenchymal signal, mass lesions, mass effect. No reduced diffusion to suggest acute ischemia. No susceptibility artifact to suggest hemorrhage. No abnormal extra-axial fluid collections. No extra-axial masses though, contrast enhanced sequences would be more sensitive. Normal major intracranial vascular flow voids seen at the skull base. Ocular globes and orbital contents are unremarkable though not tailored for evaluation. No abnormal sellar expansion. No suspicious calvarial bone marrow signal. Craniocervical junction maintained. Moderate paranasal sinus mucosal thickening without air-fluid levels. Trace LEFT mastoid effusion. IMPRESSION: Normal MRI brain. Electronically Signed   By: Awilda Metroourtnay  Bloomer M.D.   On: 03/29/2016 22:40    Scheduled Meds: . sodium chloride   Intravenous STAT  . aspirin  325 mg Oral Daily  . rosuvastatin  40 mg Oral q1800   Continuous Infusions: . sodium chloride Stopped (03/30/16 0805)   Antibiotics Given (last 72 hours)    None       Active Problems:   TIA (transient ischemic attack)   Headache    Time spent: 25 min    Kemyra August [facility]  [facility] Pager [phone]. If 7PM-7AM, please contact night-coverage at [email or web address], password [email or web address] 03/30/2016, 9:14 AM  LOS: 1 day

## 2016-03-31 ENCOUNTER — Observation Stay (HOSPITAL_BASED_OUTPATIENT_CLINIC_OR_DEPARTMENT_OTHER): Payer: Self-pay

## 2016-03-31 DIAGNOSIS — I6789 Other cerebrovascular disease: Secondary | ICD-10-CM

## 2016-03-31 DIAGNOSIS — F449 Dissociative and conversion disorder, unspecified: Secondary | ICD-10-CM

## 2016-03-31 LAB — CARDIOLIPIN ANTIBODIES, IGG, IGM, IGA
Anticardiolipin IgA: <9 APL U/mL (ref 0–11)
Anticardiolipin IgG: <9 GPL U/mL (ref 0–14)
Anticardiolipin IgM: <9 MPL U/mL (ref 0–12)

## 2016-03-31 LAB — HEMOGLOBIN A1C
Hgb A1c MFr Bld: 5.2 % (ref 4.8–5.6)
Mean Plasma Glucose: 103 mg/dL

## 2016-03-31 LAB — BETA-2-GLYCOPROTEIN I ABS, IGG/M/A
Beta-2-Glycoprotein I IgA: 10 GPI IgA units (ref 0–25)
Beta-2-Glycoprotein I IgM: <9 GPI IgM units (ref 0–32)

## 2016-03-31 LAB — LUPUS ANTICOAGULANT PANEL
DRVVT: 34.6 s (ref 0.0–44.0)
PTT Lupus Anticoagulant: 34 s (ref 0.0–43.6)

## 2016-03-31 LAB — HOMOCYSTEINE: HOMOCYSTEINE-NORM: 10.1 umol/L (ref 0.0–15.0)

## 2016-03-31 LAB — PROTEIN C, TOTAL: Protein C, Total: 80 % (ref 60–150)

## 2016-03-31 LAB — PROTEIN C ACTIVITY: Protein C Activity: 99 % (ref 73–180)

## 2016-03-31 LAB — PROTEIN S, TOTAL: PROTEIN S AG TOTAL: 68 % (ref 60–150)

## 2016-03-31 LAB — PROTEIN S ACTIVITY: Protein S Activity: 69 % (ref 63–140)

## 2016-03-31 MED ORDER — ACETAMINOPHEN 325 MG PO TABS
650.0000 mg | ORAL_TABLET | Freq: Four times a day (QID) | ORAL | Status: DC | PRN
  Administered 2016-03-31: 650 mg via ORAL
  Filled 2016-03-31: qty 2

## 2016-03-31 NOTE — Progress Notes (Signed)
OT Cancellation Note  Patient Details Name: Georgiana SpinnerRobin L Elmes MRN: [id number] DOB: 09/02/1966   Cancelled Treatment:    Reason Eval/Treat Not Completed: Patient at procedure or test/ unavailable (Echo)  Nils PyleJulia Deshondra Worst, OTR/L Pager: [phone] 03/31/2016, 2:06 PM

## 2016-03-31 NOTE — Progress Notes (Signed)
Discharge orders received.  Discharge instructions and follow-up appointments reviewed with the patient.  VSS upon discharge.  IV removed and education complete.  All belongings sent with the patient.  Transported out via wheelchair. Tyresha Fede M, RN   

## 2016-03-31 NOTE — Progress Notes (Signed)
0545-Telephone order read back received for tylenol 650 mg po prn q 6 hour, pain given by Dr. Mervin Kung. Stewart.

## 2016-03-31 NOTE — Care Management Note (Signed)
Case Management Note  Patient Details  Name: Matthew Thomas MRN: [id number] Date of Birth: 01/23/1966  Subjective/Objective:                    Action/Plan: Patient discharging home with self care. Pt with orders for rolling walker. CM notified Jermaine with [facility] and he will deliver the walker to the room. Will update the bedside RN.   Expected Discharge Date:                  Expected Discharge Plan:  Home/Self Care  In-House Referral:     Discharge planning Services  CM Consult  Post Acute Care Choice:  Durable Medical Equipment Choice offered to:  Patient  DME Arranged:  Walker rolling DME Agency:  [facility]  HH Arranged:    HH Agency:     Status of Service:  Completed, signed off  Medicare Important Message Given:    Date Medicare IM Given:    Medicare IM give by:    Date Additional Medicare IM Given:    Additional Medicare Important Message give by:     If discussed at Long Length of Stay Meetings, dates discussed:    Additional Comments:  Kermit BaloKelli F Lambros Cerro, RN 03/31/2016, 3:40 PM

## 2016-03-31 NOTE — Progress Notes (Signed)
  Echocardiogram 2D Echocardiogram has been performed.  Matthew SavoyCasey N Takera Thomas 03/31/2016, 2:46 PM

## 2016-03-31 NOTE — Progress Notes (Signed)
Physical Therapy Treatment Patient Details Name: Matthew Thomas MRN: [id number] DOB: 1966-11-30 Today's Date: 03/31/2016    History of Present Illness Presented with Lt UE and facial weakness; speech difficulties; MRI negative, +THC on admission PMHx-brother died 2 weeks ago from CVA (age 49)    PT Comments    As anticipated, pt making excellent progress with LLE strength and mobility. His functional strength continues to exceed his strength testing and he feels insecure in attempting to walk without RW (his gait does appear impaired--difficulty advancing LLE, decr stance time on Left). Did well up/down 5 steps with rails and from a PT perspective could d/c home today with RW for safety.    Follow Up Recommendations  Supervision for mobility/OOB;No PT follow up     Equipment Recommendations  Rolling walker with 5" wheels    Recommendations for Other Services       Precautions / Restrictions Precautions Precautions: Fall    Mobility  Bed Mobility Overal bed mobility: Modified Independent             General bed mobility comments: incr time and effort: required encouragement to use Lt hand to move linens  Transfers Overall transfer level: Needs assistance Equipment used: Rolling walker (2 wheeled) Transfers: Sit to/from Stand Sit to Stand: Supervision         General transfer comment: x 3; initially with cues for proper use of RW; progressed to no cues  Ambulation/Gait Ambulation/Gait assistance: Min guard;Supervision Ambulation Distance (Feet): 70 Feet Assistive device: Rolling walker (2 wheeled) Gait Pattern/deviations: Decreased step length - right;Decreased stance time - left;Decreased dorsiflexion - left;Step-to pattern;Step-through pattern Gait velocity: very slow Gait velocity interpretation: Below normal speed for age/gender General Gait Details: feels insecure walking without RW; required constant cues for increasing stride length and "pushing RW like a  grocery cart" with step-thru gait vs step-to   Stairs Stairs: Yes Stairs assistance: Min guard Stair Management: Two rails;Step to pattern;Forwards Number of Stairs: 5 General stair comments: educated in sequencing for weak LLE; no knee buckling or instability as advancing RLE  Wheelchair Mobility    Modified Rankin (Stroke Patients Only) Modified Rankin (Stroke Patients Only) Pre-Morbid Rankin Score: No symptoms Modified Rankin: Moderate disability     Balance Overall balance assessment: No apparent balance deficits (not formally assessed)   Sitting balance-Leahy Scale: Good                              Cognition Arousal/Alertness: Awake/alert Behavior During Therapy: WFL for tasks assessed/performed Overall Cognitive Status: Within Functional Limits for tasks assessed                      Exercises General Exercises - Lower Extremity Ankle Circles/Pumps: AROM;Both;10 reps Quad Sets: AROM;Both;10 reps Heel Slides: AROM;Both;5 reps    General Comments General comments (skin integrity, edema, etc.): Lots of positive encouragement      Pertinent Vitals/Pain Pain Assessment: Faces Faces Pain Scale: Hurts little more Pain Location: Lt calf Pain Descriptors / Indicators: Cramping Pain Intervention(s): Limited activity within patient's tolerance;Monitored during session;Repositioned    Home Living                      Prior Function            PT Goals (current goals can now be found in the care plan section) Acute Rehab PT Goals Patient Stated Goal: get bettter Time  For Goal Achievement: 04/03/16 Progress towards PT goals: Progressing toward goals    Frequency  Min 4X/week    PT Plan Discharge plan needs to be updated    Co-evaluation             End of Session Equipment Utilized During Treatment: Gait belt Activity Tolerance: Patient tolerated treatment well Patient left: in chair;with call bell/phone within reach      Time: 1103-1136 PT Time Calculation (min) (ACUTE ONLY): 33 min  Charges:  $Gait Training: 23-37 mins                    G Codes:      Matthew Thomas 03/31/2016, 11:50 AM Pager [phone]

## 2016-03-31 NOTE — Discharge Summary (Signed)
Physician Discharge Summary  JAHVIER ALDEA ZOX:[id number] DOB: 1966-05-12 DOA: 03/29/2016  PCP: No PCP Per Patient  Admit date: 03/29/2016 Discharge date: 03/30/2016  Time spent: 35 minutes  Recommendations for Outpatient Follow-up:  1. Outpatient TEE 2. Stop smoking 3. Stop alcohol   Discharge Diagnoses:  Active Problems:   Headache   Conversion disorder   Tobacco use disorder   Tetrahydrocannabinol (THC) use disorder, mild, abuse   Discharge Condition: improved  Diet recommendation: cardiac  Filed Weights   03/29/16 1725 03/29/16 2054  Weight: 64.501 kg (142 lb 3.2 oz) 65.273 kg (143 lb 14.4 oz)    History of present illness:  Patient is a 50 yo left handed male with no significant past medical history who came to the ED of an outside facility complaining of left side numbness that started earlier this morning around 8 am and progressed to his left face/head/leg then progressed to be associated with weakness in his left side that he was unable to get himself up : all within a few hours. He also had some difficulty expressing himself/finding words (expressive aphasia). Symptoms have improved only very mildly after he went to the ER and until now.   Hospital Course:  Mr. PLUMMER MATICH is a 50 y.o. male with no significant past medical history presenting with new onset left arm and face numbness with speech changes. He did not receive IV t-PA due to mnimal deficits and beyond time window for treatment. Consideration.   Conversion disorder  MRI Normal  CTA head and neck normal  2D Echo discussed with neuro  LDL 60 - no statin needed  HgbA1c ok- 5.2  SCDs for VTE prophylaxis  No hypercoagulable work up needed  Diet Heart Room service appropriate?: Yes; Fluid consistency:: Thin  No antithrombotic prior to admission, now on aspirin 325 mg daily. However, no ASA needed at this time per neuro.   Anxiety  Fear of stroke as brother died of mass stroke 2 weeks  ago  Relaxation  Cope with stress  Psychology if needed  Tobacco abuse  Current smoker  Smoking cessation counseling provided  Pt is willing to quit   Procedures:  echo  Consultations:  neuro  Discharge Exam: Filed Vitals:   03/31/16 1352 03/31/16 1655  BP: 106/61 107/63  Pulse: 88 57  Temp: 97.8 F (36.6 C) 98.8 F (37.1 C)  Resp: 20 16       Discharge Instructions   Discharge Instructions    Diet - low sodium heart healthy    Complete by:  As directed      Discharge instructions    Complete by:  As directed   Stop smoking and using alcohol     Increase activity slowly    Complete by:  As directed           Discharge Medication List as of 03/31/2016  4:54 PM    CONTINUE these medications which have NOT CHANGED   Details  ibuprofen (ADVIL,MOTRIN) 200 MG tablet Take 400 mg by mouth daily as needed for moderate pain., Until Discontinued, Historical Med       No Known Allergies Follow-up Information    Follow up with [facility].   Why:  Need to bring: photo ID with current address/ SS card/ month of pay stubs or letter from who you live with that you do not work/ food stamp card/ tax return or something showing you didnt file   Contact information:   [address] in [place] [phone]        The results of significant diagnostics from this hospitalization (including imaging, microbiology, ancillary and laboratory) are listed below for reference.    Significant Diagnostic Studies: Ct Angio Head W/cm &/or Wo Cm  03/29/2016  CLINICAL DATA:  LEFT-sided weakness and facial numbness. Assess for stroke. EXAM: CT ANGIOGRAPHY HEAD AND NECK TECHNIQUE: Multidetector CT imaging of the head and neck was performed using the standard protocol during bolus administration of intravenous contrast. Multiplanar CT image reconstructions and MIPs were obtained to evaluate the vascular anatomy. Carotid stenosis measurements (when  applicable) are obtained utilizing NASCET criteria, using the distal internal carotid diameter as the denominator. CONTRAST:  50 cc Isovue 370 COMPARISON:  MRI of the head April 9th 2017 at 2210 hours FINDINGS: CTA NECK Aortic arch: Normal appearance of the thoracic arch, normal branch pattern. The origins of the innominate, left Common carotid artery and subclavian artery are widely patent. Right carotid system: Common carotid artery is widely patent, coursing in a straight line fashion. Normal appearance of the carotid bifurcation without hemodynamically significant stenosis by NASCET criteria. Normal appearance of the included internal carotid artery. Left carotid system: Common carotid artery is widely patent, coursing in a straight line fashion. Normal appearance of the carotid bifurcation without hemodynamically significant stenosis by NASCET criteria. Normal appearance of the included internal carotid artery. Vertebral arteries:Left vertebral artery is dominant. Normal appearance of the vertebral arteries, which appear widely patent. Skeleton: No acute osseous process though bone windows have not been submitted. Other neck: Soft tissues of the neck are non-acute though, not tailored for evaluation. Apices demonstrate bullous changes. Multiple absent teeth and scattered dental caries. CTA HEAD Anterior circulation: Normal appearance of the cervical internal carotid arteries, petrous, cavernous and supra clinoid internal carotid arteries. Widely patent anterior communicating artery. Normal appearance of the anterior and middle cerebral arteries. Posterior circulation: Normal appearance of the vertebral arteries, vertebrobasilar junction and basilar artery, as well as main branch vessels. Normal appearance of the posterior cerebral arteries. Robust contribution by bilateral posterior communicating arteries. No large vessel occlusion, hemodynamically significant stenosis, dissection, luminal irregularity,  contrast extravasation or aneurysm within the anterior nor posterior circulation. No abnormal intracranial enhancement. IMPRESSION: Normal CTA head and neck. Electronically Signed   By: Awilda Metro M.D.   On: 03/29/2016 23:56   Ct Head Wo Contrast  03/29/2016  CLINICAL DATA:  Weakness and tingling EXAM: CT HEAD WITHOUT CONTRAST TECHNIQUE: Contiguous axial images were obtained from the base of the skull through the vertex without intravenous contrast. COMPARISON:  10/23/2014 FINDINGS: No acute cortical infarct, hemorrhage, or mass lesion ispresent. Ventricles are of normal size. No significant extra-axial fluid collection is present. Chronic mucoperiosteal thickening involving the maxillary sinuses noted. There is also mucosal thickening involving the ethmoid air cells and the frontal sinuses. The calvarium appears intact. The osseous skull is intact. IMPRESSION: 1. No acute intracranial abnormalities. 2. Chronic sinus disease. Electronically Signed   By: Signa Kell M.D.   On: 03/29/2016 18:06   Ct Angio Neck W/cm &/or Wo/cm  03/29/2016  CLINICAL DATA:  LEFT-sided weakness and facial numbness. Assess for stroke. EXAM: CT ANGIOGRAPHY HEAD AND NECK TECHNIQUE: Multidetector CT imaging of the head and neck was performed using the standard protocol during bolus administration of intravenous contrast. Multiplanar CT image reconstructions and MIPs were obtained to evaluate the vascular anatomy. Carotid stenosis measurements (when applicable) are obtained utilizing NASCET criteria, using the distal internal carotid diameter  as the denominator. CONTRAST:  50 cc Isovue 370 COMPARISON:  MRI of the head April 9th 2017 at 2210 hours FINDINGS: CTA NECK Aortic arch: Normal appearance of the thoracic arch, normal branch pattern. The origins of the innominate, left Common carotid artery and subclavian artery are widely patent. Right carotid system: Common carotid artery is widely patent, coursing in a straight line  fashion. Normal appearance of the carotid bifurcation without hemodynamically significant stenosis by NASCET criteria. Normal appearance of the included internal carotid artery. Left carotid system: Common carotid artery is widely patent, coursing in a straight line fashion. Normal appearance of the carotid bifurcation without hemodynamically significant stenosis by NASCET criteria. Normal appearance of the included internal carotid artery. Vertebral arteries:Left vertebral artery is dominant. Normal appearance of the vertebral arteries, which appear widely patent. Skeleton: No acute osseous process though bone windows have not been submitted. Other neck: Soft tissues of the neck are non-acute though, not tailored for evaluation. Apices demonstrate bullous changes. Multiple absent teeth and scattered dental caries. CTA HEAD Anterior circulation: Normal appearance of the cervical internal carotid arteries, petrous, cavernous and supra clinoid internal carotid arteries. Widely patent anterior communicating artery. Normal appearance of the anterior and middle cerebral arteries. Posterior circulation: Normal appearance of the vertebral arteries, vertebrobasilar junction and basilar artery, as well as main branch vessels. Normal appearance of the posterior cerebral arteries. Robust contribution by bilateral posterior communicating arteries. No large vessel occlusion, hemodynamically significant stenosis, dissection, luminal irregularity, contrast extravasation or aneurysm within the anterior nor posterior circulation. No abnormal intracranial enhancement. IMPRESSION: Normal CTA head and neck. Electronically Signed   By: Awilda Metro M.D.   On: 03/29/2016 23:56   Mr Brain Wo Contrast  03/29/2016  CLINICAL DATA:  LEFT-sided numbness beginning this morning, progressed to face and extremities, now with weakness. Expressive aphasia. Symptoms mildly improved. EXAM: MRI HEAD WITHOUT CONTRAST TECHNIQUE: Multiplanar,  multiecho pulse sequences of the brain and surrounding structures were obtained without intravenous contrast. COMPARISON:  CT head March 29, 2016 at 1753 hours FINDINGS: The ventricles and sulci are normal for patient's age. No abnormal parenchymal signal, mass lesions, mass effect. No reduced diffusion to suggest acute ischemia. No susceptibility artifact to suggest hemorrhage. No abnormal extra-axial fluid collections. No extra-axial masses though, contrast enhanced sequences would be more sensitive. Normal major intracranial vascular flow voids seen at the skull base. Ocular globes and orbital contents are unremarkable though not tailored for evaluation. No abnormal sellar expansion. No suspicious calvarial bone marrow signal. Craniocervical junction maintained. Moderate paranasal sinus mucosal thickening without air-fluid levels. Trace LEFT mastoid effusion. IMPRESSION: Normal MRI brain. Electronically Signed   By: Awilda Metro M.D.   On: 03/29/2016 22:40    Microbiology: No results found for this or any previous visit (from the past 240 hour(s)).   Labs: Basic Metabolic Panel: No results for input(s): NA, K, CL, CO2, GLUCOSE, BUN, CREATININE, CALCIUM, MG, PHOS in the last 168 hours. Liver Function Tests: No results for input(s): AST, ALT, ALKPHOS, BILITOT, PROT, ALBUMIN in the last 168 hours. No results for input(s): LIPASE, AMYLASE in the last 168 hours. No results for input(s): AMMONIA in the last 168 hours. CBC: No results for input(s): WBC, NEUTROABS, HGB, HCT, MCV, PLT in the last 168 hours. Cardiac Enzymes: No results for input(s): CKTOTAL, CKMB, CKMBINDEX, TROPONINI in the last 168 hours. BNP: BNP (last 3 results) No results for input(s): BNP in the last 8760 hours.  ProBNP (last 3 results) No results for input(s): PROBNP  in the last 8760 hours.  CBG: No results for input(s): GLUCAP in the last 168 hours.     Signed:  Joseph ArtJESSICA U Kalub Morillo DO  [facility] 04/08/2016,  1:39 PM

## 2016-03-31 NOTE — Progress Notes (Signed)
Patient c/o severe back pain. RN paged Dr. Karena AddisonStewart oncall. There are no pain orders at this time. Will monitor.

## 2016-03-31 NOTE — Progress Notes (Signed)
Occupational Therapy Treatment/Discharge Patient Details Name: Matthew Thomas MRN: [id number] DOB: 11/12/1966 Today's Date: 03/31/2016    History of present illness Presented with Lt UE and facial weakness; speech difficulties; MRI negative, +THC on admission PMHx-brother died 2 weeks ago from CVA (age 48)   OT comments  Pt making good progress towards functional goals. Pt demonstrated improved use of LUE during functional tasks and formal testing scores improved as well. Pt still required mod verbal encouragement to use L hand as much as possible during all tasks. Pt completed all transfers with supervision level assist for safety. Reviewed HEP, home safety and fall prevention strategies. All education has been completed and pt has no further questions. Pt is adequate for discharge. Pt with no further acute OT needs. OT signing off.   Follow Up Recommendations  No OT follow up;Supervision - Intermittent    Equipment Recommendations  None recommended by OT    Recommendations for Other Services      Precautions / Restrictions Precautions Precautions: Fall Restrictions Weight Bearing Restrictions: No       Mobility Bed Mobility Overal bed mobility: Modified Independent                Transfers Overall transfer level: Needs assistance Equipment used: Rolling walker (2 wheeled) Transfers: Sit to/from Stand Sit to Stand: Supervision         General transfer comment: Supervision for safety. Cues for proper use of RW intially and pt demonstrated understanding    Balance Overall balance assessment: No apparent balance deficits (not formally assessed) (able to maintain balance with eyes closed for 10 seconds)   Sitting balance-Leahy Scale: Normal       Standing balance-Leahy Scale: Good                     ADL Overall ADL's : Needs assistance/impaired     Grooming: Wash/dry hands   Upper Body Bathing: Set up;Sitting Upper Body Bathing Details (indicate cue  type and reason): increased time and encouragement to use L hand Lower Body Bathing: Set up;Sit to/from stand Lower Body Bathing Details (indicate cue type and reason): increased time and encouragement to use L hand Upper Body Dressing : Set up;Sitting Upper Body Dressing Details (indicate cue type and reason): increased time and encouragement to use L hand Lower Body Dressing: Set up;Sit to/from stand Lower Body Dressing Details (indicate cue type and reason): increased time and encouragement to use L hand Toilet Transfer: Supervision/safety;Ambulation;Regular Toilet;RW   Toileting- Clothing Manipulation and Hygiene: Supervision/safety;Sit to/from stand   Tub/ Shower Transfer: Walk-in shower;Supervision/safety;Ambulation;Rolling walker   Functional mobility during ADLs: Supervision/safety;Rolling walker General ADL Comments: Pt able to grasp objects of varying heights, widths, and weight with mod encouragement to use L hand. Pt demonstrated improved functional use of LUE during ADLs. However, x3 pt began to reach for object with L hand then dropped hand down and grabbed with R hand - pt demonstrating self-limiting behaviors at times. Reviewed fall prevention and home safety as well such as not using sharp or high temperature objects with L hand until use is back to normal.      Vision                     Perception     Praxis      Cognition   Behavior During Therapy: [facility] for tasks assessed/performed Overall Cognitive Status: Within Functional Limits for tasks assessed  Extremity/Trunk Assessment               Exercises General Exercises - Upper Extremity Shoulder Flexion: AROM;Left;10 reps;Supine;Seated Shoulder ABduction: AROM;Left;10 reps;Supine;Seated Elbow Flexion: AROM;10 reps;Left;Supine;Seated Elbow Extension: AROM;Left;10 reps;Supine;Seated Wrist Flexion: AROM;Left;10 reps;Supine;Seated Wrist Extension: AROM;Left;10  reps;Supine;Seated Digit Composite Flexion: AROM;Left;10 reps;Supine;Seated Composite Extension: AROM;Left;10 reps;Supine;Seated   Shoulder Instructions       General Comments      Pertinent Vitals/ Pain       Pain Assessment: No/denies pain  Home Living                                          Prior Functioning/Environment              Frequency       Progress Toward Goals  OT Goals(current goals can now be found in the care plan section)  Progress towards OT goals: Goals met/education completed, patient discharged from OT  Acute Rehab OT Goals Patient Stated Goal: get bettter OT Goal Formulation: With patient Time For Goal Achievement: 04/06/16 Potential to Achieve Goals: Good ADL Goals Pt Will Perform Lower Body Dressing: with set-up;with supervision;sit to/from stand Pt Will Transfer to Toilet: with supervision;ambulating;regular height toilet;grab bars Pt Will Perform Toileting - Clothing Manipulation and hygiene: with supervision;sit to/from stand Pt Will Perform Tub/Shower Transfer: ambulating;Shower transfer;3 in 1;with supervision;with set-up Additional ADL Goal #1: Pt will independently perform HEP for LUE and use LUE functionally in session.  Plan Discharge plan needs to be updated;All goals met and education completed, patient discharged from OT services    Co-evaluation                 End of Session Equipment Utilized During Treatment: Gait belt;Rolling walker   Activity Tolerance Patient tolerated treatment well   Patient Left in bed;with call bell/phone within reach   Nurse Communication Mobility status    Functional Assessment Tool Used: clinical judgment Functional Limitation: Self care Self Care Goal Status (B8466): At least 1 percent but less than 20 percent impaired, limited or restricted Self Care Discharge Status [phone]): At least 1 percent but less than 20 percent impaired, limited or restricted   Time:  1511-1526 OT Time Calculation (min): 15 min  Charges: OT G-codes **NOT FOR INPATIENT CLASS** Functional Assessment Tool Used: clinical judgment Functional Limitation: Self care Self Care Goal Status (T0177): At least 1 percent but less than 20 percent impaired, limited or restricted Self Care Discharge Status [phone]): At least 1 percent but less than 20 percent impaired, limited or restricted OT General Charges $OT Visit: 1 Procedure OT Treatments $Self Care/Home Management : 8-22 mins  Redmond Baseman, OTR/L Pager: [phone] 03/31/2016, 3:54 PM

## 2016-04-03 LAB — FACTOR 5 LEIDEN

## 2016-04-06 LAB — PROTHROMBIN GENE MUTATION

## 2017-04-07 ENCOUNTER — Emergency Department (HOSPITAL_BASED_OUTPATIENT_CLINIC_OR_DEPARTMENT_OTHER): Payer: Self-pay

## 2017-04-07 ENCOUNTER — Emergency Department (HOSPITAL_BASED_OUTPATIENT_CLINIC_OR_DEPARTMENT_OTHER)
Admission: EM | Admit: 2017-04-07 | Discharge: 2017-04-07 | Disposition: A | Discharge status: Home / Self Care | Payer: Self-pay | Attending: Emergency Medicine | Admitting: Emergency Medicine

## 2017-04-07 ENCOUNTER — Encounter (HOSPITAL_BASED_OUTPATIENT_CLINIC_OR_DEPARTMENT_OTHER): Payer: Self-pay | Admitting: Emergency Medicine

## 2017-04-07 DIAGNOSIS — M549 Dorsalgia, unspecified: Secondary | ICD-10-CM | POA: Insufficient documentation

## 2017-04-07 DIAGNOSIS — R0989 Other specified symptoms and signs involving the circulatory and respiratory systems: Secondary | ICD-10-CM | POA: Insufficient documentation

## 2017-04-07 DIAGNOSIS — F17210 Nicotine dependence, cigarettes, uncomplicated: Secondary | ICD-10-CM | POA: Insufficient documentation

## 2017-04-07 DIAGNOSIS — R531 Weakness: Secondary | ICD-10-CM | POA: Insufficient documentation

## 2017-04-07 DIAGNOSIS — R6883 Chills (without fever): Secondary | ICD-10-CM | POA: Insufficient documentation

## 2017-04-07 DIAGNOSIS — R079 Chest pain, unspecified: Secondary | ICD-10-CM | POA: Insufficient documentation

## 2017-04-07 DIAGNOSIS — R05 Cough: Secondary | ICD-10-CM | POA: Insufficient documentation

## 2017-04-07 DIAGNOSIS — J111 Influenza due to unidentified influenza virus with other respiratory manifestations: Secondary | ICD-10-CM

## 2017-04-07 DIAGNOSIS — R69 Illness, unspecified: Secondary | ICD-10-CM

## 2017-04-07 DIAGNOSIS — H9209 Otalgia, unspecified ear: Secondary | ICD-10-CM | POA: Insufficient documentation

## 2017-04-07 DIAGNOSIS — R51 Headache: Secondary | ICD-10-CM | POA: Insufficient documentation

## 2017-04-07 DIAGNOSIS — R5383 Other fatigue: Secondary | ICD-10-CM | POA: Insufficient documentation

## 2017-04-07 DIAGNOSIS — R0981 Nasal congestion: Secondary | ICD-10-CM | POA: Insufficient documentation

## 2017-04-07 MED ORDER — GUAIFENESIN 200 MG PO TABS
400.0000 mg | ORAL_TABLET | Freq: Once | ORAL | Status: DC
  Filled 2017-04-07: qty 2

## 2017-04-07 MED ORDER — IBUPROFEN 800 MG PO TABS
800.0000 mg | ORAL_TABLET | Freq: Once | ORAL | Status: AC
  Administered 2017-04-07: 800 mg via ORAL
  Filled 2017-04-07: qty 1

## 2017-04-07 MED ORDER — GUAIFENESIN 100 MG/5ML PO SOLN
ORAL | Status: AC
  Filled 2017-04-07: qty 20

## 2017-04-07 MED ORDER — BENZONATATE 100 MG PO CAPS
100.0000 mg | ORAL_CAPSULE | Freq: Once | ORAL | Status: AC
Start: 2017-04-07 — End: 2017-04-07
  Administered 2017-04-07: 100 mg via ORAL
  Filled 2017-04-07: qty 1

## 2017-04-07 MED ORDER — GUAIFENESIN 100 MG/5ML PO SOLN
10.0000 mL | Freq: Once | ORAL | Status: AC
  Administered 2017-04-07: 200 mg via ORAL

## 2017-04-07 NOTE — ED Provider Notes (Signed)
TIME SEEN: 4:25 AM  CHIEF COMPLAINT: Flulike symptoms  HPI: Patient is a 51 year old male with no significant past medical history who presents emergency department with flulike symptoms for the past 3 days. Has had chills, body aches, congestion, cough with yellow sputum production. No vomiting or diarrhea. Started off with mild sore throat that has resolved. Multiple sick contacts at work. No recent travel.  ROS: See HPI Constitutional: no fever  Eyes: no drainage  ENT:  runny nose   Cardiovascular:  no chest pain  Resp: no SOB  GI: no vomiting GU: no dysuria Integumentary: no rash  Allergy: no hives  Musculoskeletal: no leg swelling  Neurological: no slurred speech ROS otherwise negative  PAST MEDICAL HISTORY/PAST SURGICAL HISTORY:  History reviewed. No pertinent past medical history.  MEDICATIONS:  Prior to Admission medications   Medication Sig Start Date End Date Taking? Authorizing Provider  ibuprofen (ADVIL,MOTRIN) 200 MG tablet Take 400 mg by mouth daily as needed for moderate pain.    Historical Provider, MD    ALLERGIES:  No Known Allergies  SOCIAL HISTORY:  Social History  Substance Use Topics  . Smoking status: Current Every Day Smoker    Packs/day: 0.50    Types: Cigarettes  . Smokeless tobacco: Never Used  . Alcohol use 0.6 oz/week    1 Cans of beer per week     Comment: 1 beer or 1 glass of wine per week    FAMILY HISTORY: No family history on file.  EXAM: BP 113/72 (BP Location: Left Arm)   Temp 98.4 F (36.9 C) (Oral)   Resp 16   Ht 5' 11.5" (1.816 m)   Wt 150 lb (68 kg)   SpO2 95%   BMI 20.63 kg/m  CONSTITUTIONAL: Alert and oriented and responds appropriately to questions. Well-appearing; well-nourished HEAD: Normocephalic EYES: Conjunctivae clear, pupils appear equal, EOMI ENT: normal nose; moist mucous membranes; No pharyngeal erythema or petechiae, no tonsillar hypertrophy or exudate, no uvular deviation, no unilateral swelling, no  trismus or drooling, no muffled voice, normal phonation, no stridor, no dental caries present, no drainable dental abscess noted, no Ludwig's angina, tongue sits flat in the bottom of the mouth, no angioedema, no facial erythema or warmth, no facial swelling; no pain with movement of the neck.  TMs are clear bilaterally without erythema, purulence, bulging, perforation, effusion.  No cerumen impaction or sign of foreign body in the external auditory canal. No inflammation, erythema or drainage from the external auditory canal. No signs of mastoiditis. No pain with manipulation of the pinna bilaterally. NECK: Supple, no meningismus, no nuchal rigidity, no LAD  CARD: RRR; S1 and S2 appreciated; no murmurs, no clicks, no rubs, no gallops RESP: Normal chest excursion without splinting or tachypnea; breath sounds clear and equal bilaterally; no wheezes, no rhonchi, no rales, no hypoxia or respiratory distress, speaking full sentences ABD/GI: Normal bowel sounds; non-distended; soft, non-tender, no rebound, no guarding, no peritoneal signs, no hepatosplenomegaly BACK:  The back appears normal and is non-tender to palpation, there is no CVA tenderness EXT: Normal ROM in all joints; non-tender to palpation; no edema; normal capillary refill; no cyanosis, no calf tenderness or swelling    SKIN: Normal color for age and race; warm; no rash NEURO: Moves all extremities equally PSYCH: The patient's mood and manner are appropriate. Grooming and personal hygiene are appropriate.  MEDICAL DECISION MAKING: Patient here with flulike symptoms. Chest x-ray shows no pneumonia. No sign of meningitis on exam, pharyngitis, deep space neck  infection, peritonsillar abscess. Lungs are clear. No hypoxia or increased work of breathing. I do not feel he needs to be on antibiotics. He is outside treatment window for Tamiflu. Discussed supportive care instructions and return precautions. We'll provide work note. Recommended rest,  increase fluid intake and over-the-counter medications. Patient and family at bedside comfortable with this plan.   At this time, I do not feel there is any life-threatening condition present. I have reviewed and discussed all results (EKG, imaging, lab, urine as appropriate) and exam findings with patient/family. I have reviewed nursing notes and appropriate previous records.  I feel the patient is safe to be discharged home without further emergent workup and can continue workup as an outpatient as needed. Discussed usual and customary return precautions. Patient/family verbalize understanding and are comfortable with this plan.  Outpatient follow-up has been provided if needed. All questions have been answered.      Layla Maw Meredith Kilbride, DO 04/07/17 [phone]

## 2017-04-07 NOTE — ED Notes (Signed)
ED Provider at bedside. 

## 2017-04-07 NOTE — Discharge Instructions (Signed)
You may alternate Tylenol 1000 mg every 6 hours as needed for pain and Ibuprofen 800 mg every 8 hours as needed for pain.  Please take Ibuprofen with food. ° ° ° °You may alternate Tylenol 1000 mg every 6 hours as needed for fever and pain and ibuprofen 800 mg every 8 hours as needed for fever and pain. Please rest and drink plenty of fluids. This is a viral illness causing your symptoms. You do not need antibiotics for a virus. You may use over-the-counter nasal saline spray and Afrin nasal saline spray as needed for nasal congestion. Please do not use Afrin for more than 3 days in a row. You may use Mucinex as needed for cough.  This may take 7-14 days to run its course. ° °We do not test for the flu from the emergency department as we do not have rapid flu swabs and it takes hours for this test to come back and it would not change our management. The flu is treated like any other virus with supportive measures as listed above. At this time you are outside the treatment window for Tamiflu. Tamiflu has to be taken within the first 48 hours of symptoms. ° ° ° °To find a primary care or specialty doctor please call [phone] or [phone] to access "[facility] Find a Doctor Service." ° °You may also go on the [facility] website at [email or web address] ° °There are also multiple [facility], [facility], [facility] and [facility] practices throughout the [place] that are frequently accepting new patients. You may find a clinic that is close to your home and contact them. ° °[facility] -  °[address] °[place] [address] °[phone] ° ° °[facility] -  °[address] °[place] [address] °[phone] ° ° °[facility] - °[address]  °[place]  [address] °[phone] ° ° °

## 2017-04-07 NOTE — ED Triage Notes (Signed)
Head and chest congestion, cough, body aches, ear pain, fatigue and weakness x3 days. Chest pain and back pain with cough.

## 2018-02-20 ENCOUNTER — Emergency Department (HOSPITAL_BASED_OUTPATIENT_CLINIC_OR_DEPARTMENT_OTHER)
Admission: EM | Admit: 2018-02-20 | Discharge: 2018-02-20 | Disposition: A | Discharge status: Home / Self Care | Payer: Self-pay | Attending: Emergency Medicine | Admitting: Emergency Medicine

## 2018-02-20 ENCOUNTER — Emergency Department (HOSPITAL_BASED_OUTPATIENT_CLINIC_OR_DEPARTMENT_OTHER): Payer: Self-pay

## 2018-02-20 ENCOUNTER — Other Ambulatory Visit: Payer: Self-pay

## 2018-02-20 ENCOUNTER — Encounter (HOSPITAL_BASED_OUTPATIENT_CLINIC_OR_DEPARTMENT_OTHER): Payer: Self-pay

## 2018-02-20 DIAGNOSIS — W010XXA Fall on same level from slipping, tripping and stumbling without subsequent striking against object, initial encounter: Secondary | ICD-10-CM | POA: Insufficient documentation

## 2018-02-20 DIAGNOSIS — S93402A Sprain of unspecified ligament of left ankle, initial encounter: Secondary | ICD-10-CM

## 2018-02-20 DIAGNOSIS — W19XXXA Unspecified fall, initial encounter: Secondary | ICD-10-CM

## 2018-02-20 DIAGNOSIS — F17210 Nicotine dependence, cigarettes, uncomplicated: Secondary | ICD-10-CM | POA: Insufficient documentation

## 2018-02-20 DIAGNOSIS — Z23 Encounter for immunization: Secondary | ICD-10-CM | POA: Insufficient documentation

## 2018-02-20 DIAGNOSIS — Y92018 Other place in single-family (private) house as the place of occurrence of the external cause: Secondary | ICD-10-CM | POA: Insufficient documentation

## 2018-02-20 DIAGNOSIS — Z8673 Personal history of transient ischemic attack (TIA), and cerebral infarction without residual deficits: Secondary | ICD-10-CM | POA: Insufficient documentation

## 2018-02-20 DIAGNOSIS — Y999 Unspecified external cause status: Secondary | ICD-10-CM | POA: Insufficient documentation

## 2018-02-20 DIAGNOSIS — Y9301 Activity, walking, marching and hiking: Secondary | ICD-10-CM | POA: Insufficient documentation

## 2018-02-20 DIAGNOSIS — S8391XA Sprain of unspecified site of right knee, initial encounter: Secondary | ICD-10-CM

## 2018-02-20 MED ORDER — TETANUS-DIPHTH-ACELL PERTUSSIS 5-2.5-18.5 LF-MCG/0.5 IM SUSP
0.5000 mL | Freq: Once | INTRAMUSCULAR | Status: AC
  Administered 2018-02-20: 0.5 mL via INTRAMUSCULAR
  Filled 2018-02-20: qty 0.5

## 2018-02-20 NOTE — ED Triage Notes (Signed)
PT reports slip and fall last night on a wet porch while trying to walk his dog. Patient reports pain to right knee, left ankle. Pt denies hitting head, denies LOC.

## 2018-02-20 NOTE — ED Provider Notes (Signed)
[facility] EMERGENCY DEPARTMENT Provider Note   CSN: [id number] Arrival date & time: 02/20/18  5784     History   Chief Complaint Chief Complaint  Patient presents with  . Fall    HPI Matthew Thomas is a 52 y.o. male.  Patient complains of pain to his left ankle and right knee.  He states he was walking his dog last night and slipped off a porch.  There is currently not a railing on the porch and he fell 2-3 feet.  At this point he twisted his left ankle and he states his right knee buckled.  He complains of pain to his right knee behind the patella and also pain to his left ankle.  It hurts to bear weight on both of his legs.  He denies hitting his head.  No loss of consciousness.  He denies any neck or back pain.  He denies any chest or abdominal injuries.  He is unsure when his last tetanus shot was.      History reviewed. No pertinent past medical history.  Patient Active Problem List   Diagnosis Date Noted  . TIA (transient ischemic attack) 03/30/2016  . Headache 03/30/2016  . Conversion disorder   . Tobacco use disorder   . Tetrahydrocannabinol (THC) use disorder, mild, abuse   . CVA (cerebral infarction) 03/29/2016  . Stroke [facility]) 03/29/2016    Past Surgical History:  Procedure Laterality Date  . APPENDECTOMY         Home Medications    Prior to Admission medications   Medication Sig Start Date End Date Taking? Authorizing Provider  ibuprofen (ADVIL,MOTRIN) 200 MG tablet Take 400 mg by mouth daily as needed for moderate pain.   Yes [provider]    Family History History reviewed. No pertinent family history.  Social History Social History   Tobacco Use  . Smoking status: Current Every Day Smoker    Packs/day: 0.50    Types: Cigarettes  . Smokeless tobacco: Never Used  Substance Use Topics  . Alcohol use: Yes    Alcohol/week: 0.6 oz    Types: 1 Cans of beer per week    Comment: 1 beer or 1 glass of wine per week  . Drug  use: Yes    Types: Marijuana    Comment: Pt stated that he smokes marijuana once a month     Allergies   Patient has no known allergies.   Review of Systems Review of Systems  Constitutional: Negative for fever.  Gastrointestinal: Negative for nausea and vomiting.  Musculoskeletal: Positive for arthralgias and joint swelling. Negative for back pain and neck pain.  Skin: Positive for wound.  Neurological: Negative for weakness, numbness and headaches.     Physical Exam Updated Vital Signs BP 126/76 (BP Location: Right Arm)   Pulse 80   Temp 98.6 F (37 C) (Oral)   Resp 18   Ht 5\' 11"  (1.803 m)   Wt 68 kg (150 lb)   SpO2 98%   BMI 20.92 kg/m   Physical Exam  Constitutional: He is oriented to person, place, and time. He appears well-developed and well-nourished.  HENT:  Head: Normocephalic and atraumatic.  Neck: Normal range of motion. Neck supple.  Cardiovascular: Normal rate.  Pulmonary/Chest: Effort normal.  Musculoskeletal: He exhibits edema and tenderness.  Patient has tenderness along both malleoli of the left ankle.  There is minimal swelling.  There is no pain to the foot.  No pain to the knee  or hip.  He has normal pulses in both feet.  He has normal sensation and motor function in both lower extremities.  He has some tenderness on palpation of the bilateral aspects of the right knee.  There is no swelling or effusion noted.  No deformity.  There is no pain to the hip or ankle on the right.  There is a small abrasion overlying the proximal tibia.  Neurological: He is alert and oriented to person, place, and time.  Skin: Skin is warm and dry.  Psychiatric: He has a normal mood and affect.     ED Treatments / Results  Labs (all labs ordered are listed, but only abnormal results are displayed) Labs Reviewed - No data to display  EKG  EKG Interpretation None       Radiology Dg Ankle Complete Left  Result Date: 02/20/2018 CLINICAL DATA:  Patient slipped  and fell on a wet porch last night. Complaining pain across the ankle to the lateral malleolus. Unable to bear weight. EXAM: LEFT ANKLE COMPLETE - 3+ VIEW COMPARISON:  None. FINDINGS: There is no evidence of fracture, dislocation, or joint effusion. There is no evidence of arthropathy or other focal bone abnormality. Soft tissues are unremarkable. IMPRESSION: Negative. Electronically Signed   By: Amie Portlandavid  Ormond M.D.   On: 02/20/2018 08:12   Dg Knee Complete 4 Views Right  Result Date: 02/20/2018 CLINICAL DATA:  Slipped and fell on a wet porch last night. Complaining of knee pain. Unable to bear weight. EXAM: RIGHT KNEE - COMPLETE 4+ VIEW COMPARISON:  None. FINDINGS: No evidence of fracture, dislocation, or joint effusion. No evidence of arthropathy or other focal bone abnormality. Soft tissues are unremarkable. IMPRESSION: Negative. Electronically Signed   By: Amie Portlandavid  Ormond M.D.   On: 02/20/2018 08:13    Procedures Procedures (including critical care time)  Medications Ordered in ED Medications  Tdap (BOOSTRIX) injection 0.5 mL (0.5 mLs Intramuscular Given 02/20/18 0819)     Initial Impression / Assessment and Plan / ED Course  I have reviewed the triage vital signs and the nursing notes.  Pertinent labs & imaging results that were available during my care of the patient were reviewed by me and considered in my medical decision making (see chart for details).     Patient is a [age] year old male who presents after a fall.  He has pain to his ankle and knee.  No other apparent injuries.  He denies any head injury.  He denies any neck or back injuries.  There are no bony injuries visualized on imaging studies.  He was placed in an ASO ankle splint as well as a knee sleeve.  He was advised on ice and elevation.  He was given crutches to use.  He was advised to use ibuprofen for symptomatic relief.  He was given a referral to follow-up with Dr. Pearletha ForgeHudnall if his symptoms are not improving.  Final Clinical  Impressions(s) / ED Diagnoses   Final diagnoses:  Fall, initial encounter  Sprain of left ankle, unspecified ligament, initial encounter  Sprain of right knee, unspecified ligament, initial encounter    ED Discharge Orders    None       Rolan BuccoBelfi, Roosevelt Eimers, MD 02/20/18 [phone]

## 2018-08-30 ENCOUNTER — Emergency Department (HOSPITAL_BASED_OUTPATIENT_CLINIC_OR_DEPARTMENT_OTHER)
Admission: EM | Admit: 2018-08-30 | Discharge: 2018-08-31 | Disposition: A | Discharge status: Home / Self Care | Payer: BLUE CROSS/BLUE SHIELD | Attending: Emergency Medicine | Admitting: Emergency Medicine

## 2018-08-30 ENCOUNTER — Other Ambulatory Visit: Payer: Self-pay

## 2018-08-30 ENCOUNTER — Encounter (HOSPITAL_BASED_OUTPATIENT_CLINIC_OR_DEPARTMENT_OTHER): Payer: Self-pay

## 2018-08-30 DIAGNOSIS — Z79899 Other long term (current) drug therapy: Secondary | ICD-10-CM | POA: Insufficient documentation

## 2018-08-30 DIAGNOSIS — N3281 Overactive bladder: Secondary | ICD-10-CM | POA: Insufficient documentation

## 2018-08-30 DIAGNOSIS — N3289 Other specified disorders of bladder: Secondary | ICD-10-CM

## 2018-08-30 DIAGNOSIS — F17210 Nicotine dependence, cigarettes, uncomplicated: Secondary | ICD-10-CM | POA: Insufficient documentation

## 2018-08-30 DIAGNOSIS — R319 Hematuria, unspecified: Secondary | ICD-10-CM | POA: Diagnosis present

## 2018-08-30 HISTORY — DX: Cerebral infarction, unspecified: I63.9

## 2018-08-30 LAB — URINALYSIS, ROUTINE W REFLEX MICROSCOPIC
Bilirubin Urine: NEGATIVE
Glucose, UA: NEGATIVE mg/dL
Hgb urine dipstick: NEGATIVE
Ketones, ur: NEGATIVE mg/dL
LEUKOCYTES UA: NEGATIVE
Nitrite: NEGATIVE
PH: 5.5 (ref 5.0–8.0)
Protein, ur: NEGATIVE mg/dL
SPECIFIC GRAVITY, URINE: 1.025 (ref 1.005–1.030)

## 2018-08-30 NOTE — ED Provider Notes (Signed)
[facility] EMERGENCY DEPARTMENT Provider Note   CSN: [id number] Arrival date & time: 08/30/18  2114     History   Chief Complaint Chief Complaint  Patient presents with  . Hematuria    HPI Matthew Thomas is a 52 y.o. male.  The history is provided by the patient.  He has a history of conversion disorder and comes in complaining of difficulty urinating.  He states that he feels he has to go, but only a small amount is coming out.  He then feels like he has to go again.  He denies abdominal pain or flank pain.  Denies fever or chills.  He has had on a couple occasions that there appeared to be some blood at the end of the urinary stream.  He denies any urethral discharge.  Past Medical History:  Diagnosis Date  . Stroke [facility])     Patient Active Problem List   Diagnosis Date Noted  . TIA (transient ischemic attack) 03/30/2016  . Headache 03/30/2016  . Conversion disorder   . Tobacco use disorder   . Tetrahydrocannabinol (THC) use disorder, mild, abuse   . CVA (cerebral infarction) 03/29/2016  . Stroke [facility]) 03/29/2016    Past Surgical History:  Procedure Laterality Date  . APPENDECTOMY          Home Medications    Prior to Admission medications   Medication Sig Start Date End Date Taking? Authorizing Provider  ibuprofen (ADVIL,MOTRIN) 200 MG tablet Take 400 mg by mouth daily as needed for moderate pain.    [provider]    Family History No family history on file.  Social History Social History   Tobacco Use  . Smoking status: Current Every Day Smoker    Packs/day: 0.50    Types: Cigarettes  . Smokeless tobacco: Never Used  Substance Use Topics  . Alcohol use: Yes    Alcohol/week: 1.0 standard drinks    Types: 1 Cans of beer per week    Comment: 1 beer or 1 glass of wine per week  . Drug use: Yes    Types: Marijuana    Comment: Pt stated that he smokes marijuana once a month     Allergies   Patient has no known  allergies.   Review of Systems Review of Systems  All other systems reviewed and are negative.    Physical Exam Updated Vital Signs BP 116/78 (BP Location: Right Arm)   Pulse 65   Temp 98.4 F (36.9 C) (Oral)   Resp 16   Ht 5\' 11"  (1.803 m)   Wt 68 kg   SpO2 99%   BMI 20.92 kg/m   Physical Exam  Nursing note and vitals reviewed.  52 year old male, resting comfortably and in no acute distress. Vital signs are normal. Oxygen saturation is 99%, which is normal. Head is normocephalic and atraumatic. PERRLA, EOMI. Oropharynx is clear. Neck is nontender and supple without adenopathy or JVD. Back is nontender and there is no CVA tenderness. Lungs are clear without rales, wheezes, or rhonchi. Chest is nontender. Heart has regular rate and rhythm without murmur. Abdomen is soft, flat, nontender without masses or hepatosplenomegaly and peristalsis is normoactive. Extremities have no cyanosis or edema, full range of motion is present. Skin is warm and dry without rash. Neurologic: Mental status is normal, cranial nerves are intact, there are no motor or sensory deficits.  ED Treatments / Results  Labs (all labs ordered are listed, but only abnormal results  are displayed) Labs Reviewed  URINALYSIS, ROUTINE W REFLEX MICROSCOPIC   Procedures Procedures   Medications Ordered in ED Medications - No data to display   Initial Impression / Assessment and Plan / ED Course  I have reviewed the triage vital signs and the nursing notes.  Pertinent lab results that were available during my care of the patient were reviewed by me and considered in my medical decision making (see chart for details).  Urinary difficulty which sounds like bladder spasm.  Urinalysis is completely normal.  No evidence of bladder distention on physical exam.  Will check postvoid residual.  Old records are reviewed, and he has no relevant past visits.  Of note, his history states past history of stroke, but  hospital admission for stroke was actually a conversion disorder and he had a negative MRI of the brain at that time.  Post void residual was actually obtained about 1/2-hour after urinating, and was 140 mL.  He had a sense that he had to urinate with this degree of bladder distention.  I do feel this is consistent with spastic bladder and he is discharged with prescription for oxybutynin, follow-up with urology.  Return precautions discussed.  Final Clinical Impressions(s) / ED Diagnoses   Final diagnoses:  Spastic bladder    ED Discharge Orders         Ordered    oxybutynin (DITROPAN) 5 MG tablet  2 times daily     08/31/18 0035           Dione Booze, MD 08/31/18 [id number]

## 2018-08-30 NOTE — ED Triage Notes (Signed)
Pt c/o blood in his urine since Saturday, denies flank pain, c/o difficulty urinating and feeling like he's not emptying his bladder

## 2018-08-31 MED ORDER — OXYBUTYNIN CHLORIDE 5 MG PO TABS: 5.0000 mg | ORAL_TABLET | Freq: Two times a day (BID) | ORAL | 0 refills | Status: AC

## 2018-08-31 NOTE — Discharge Instructions (Addendum)
You are being started on a low dose of the medicine to reduce bladder spasms. If it seems to be helping, but not enough, you may need to have the dose increased.  If you have difficulty emptying your bladder, return to the Emergency Department.  Follow up with the urologist for further evaluation and testing.

## 2018-12-14 IMAGING — CR DG ANKLE COMPLETE 3+V*L*
3 series · 3 of 3 positions shown · non-contrast
Comparison: None.

CLINICAL DATA: Patient slipped and fell on a wet porch last night.
Complaining pain across the ankle to the lateral malleolus. Unable
to bear weight.

EXAM:
LEFT ANKLE COMPLETE - 3+ VIEW

[t ankle joint ap left]
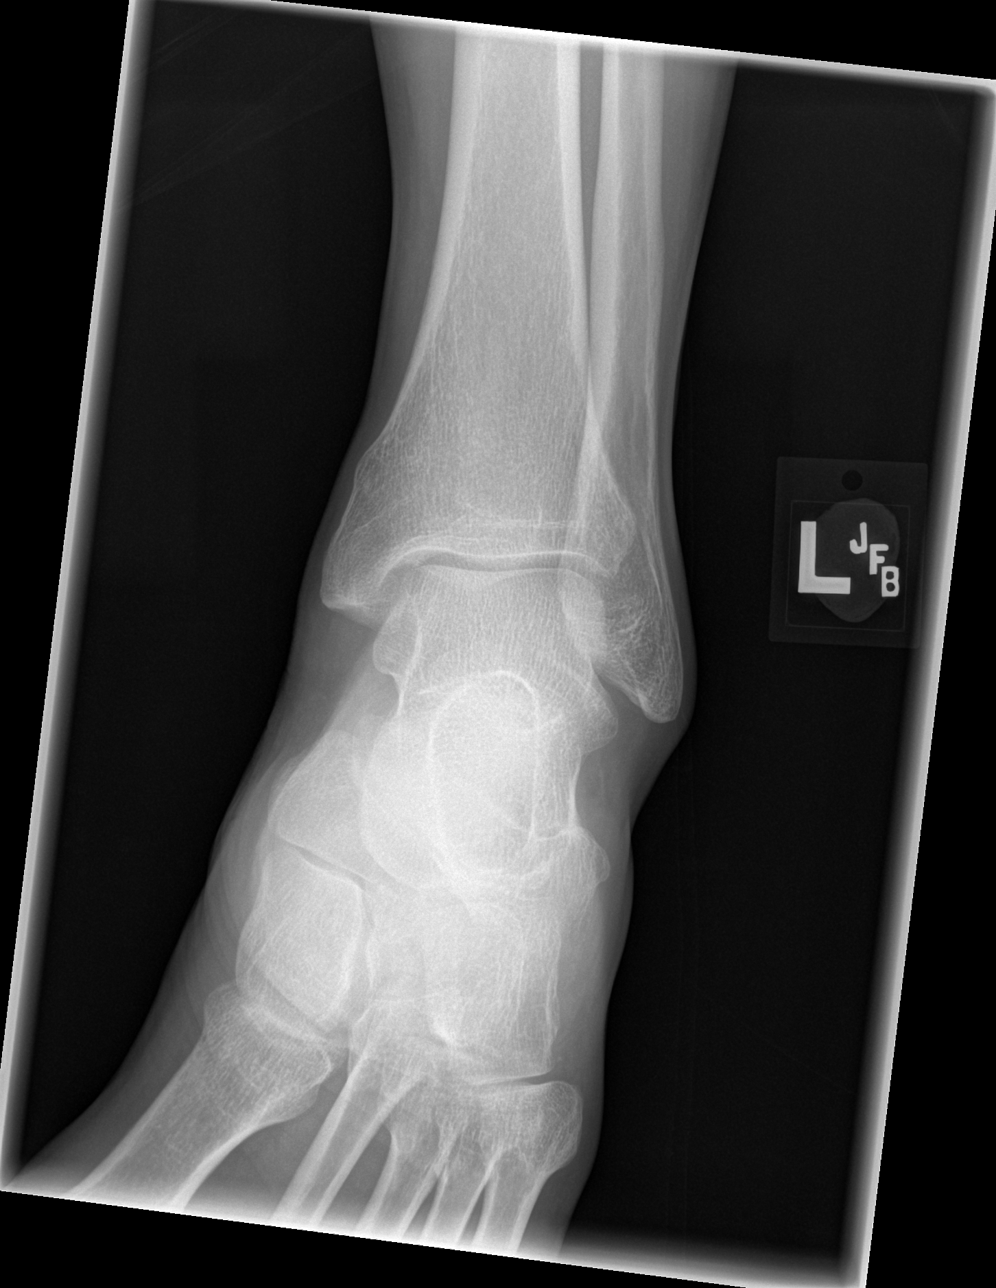

[t ankle joint oblique left]
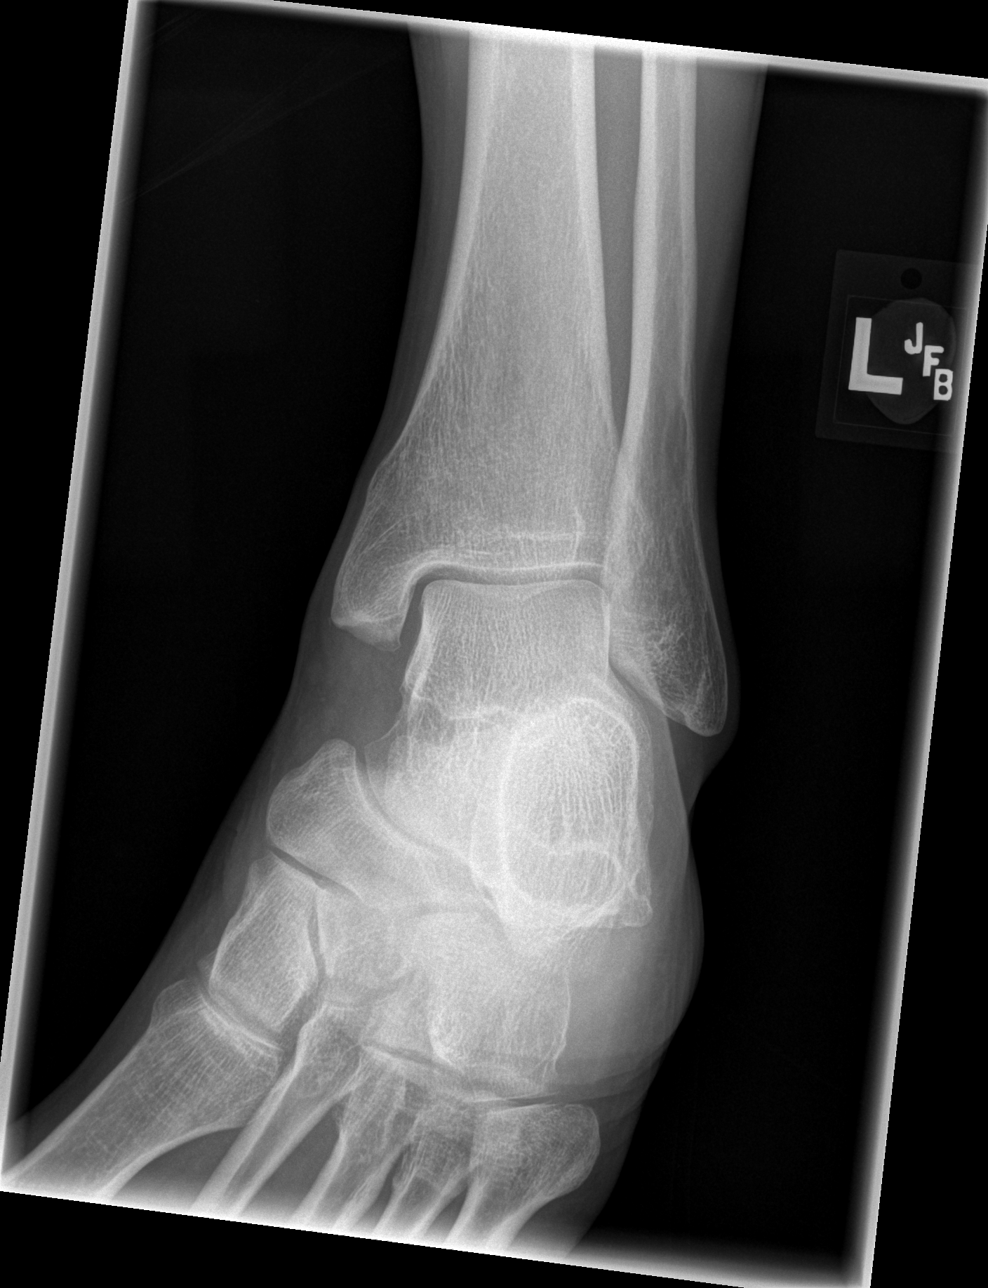

[t ankle joint lat left]
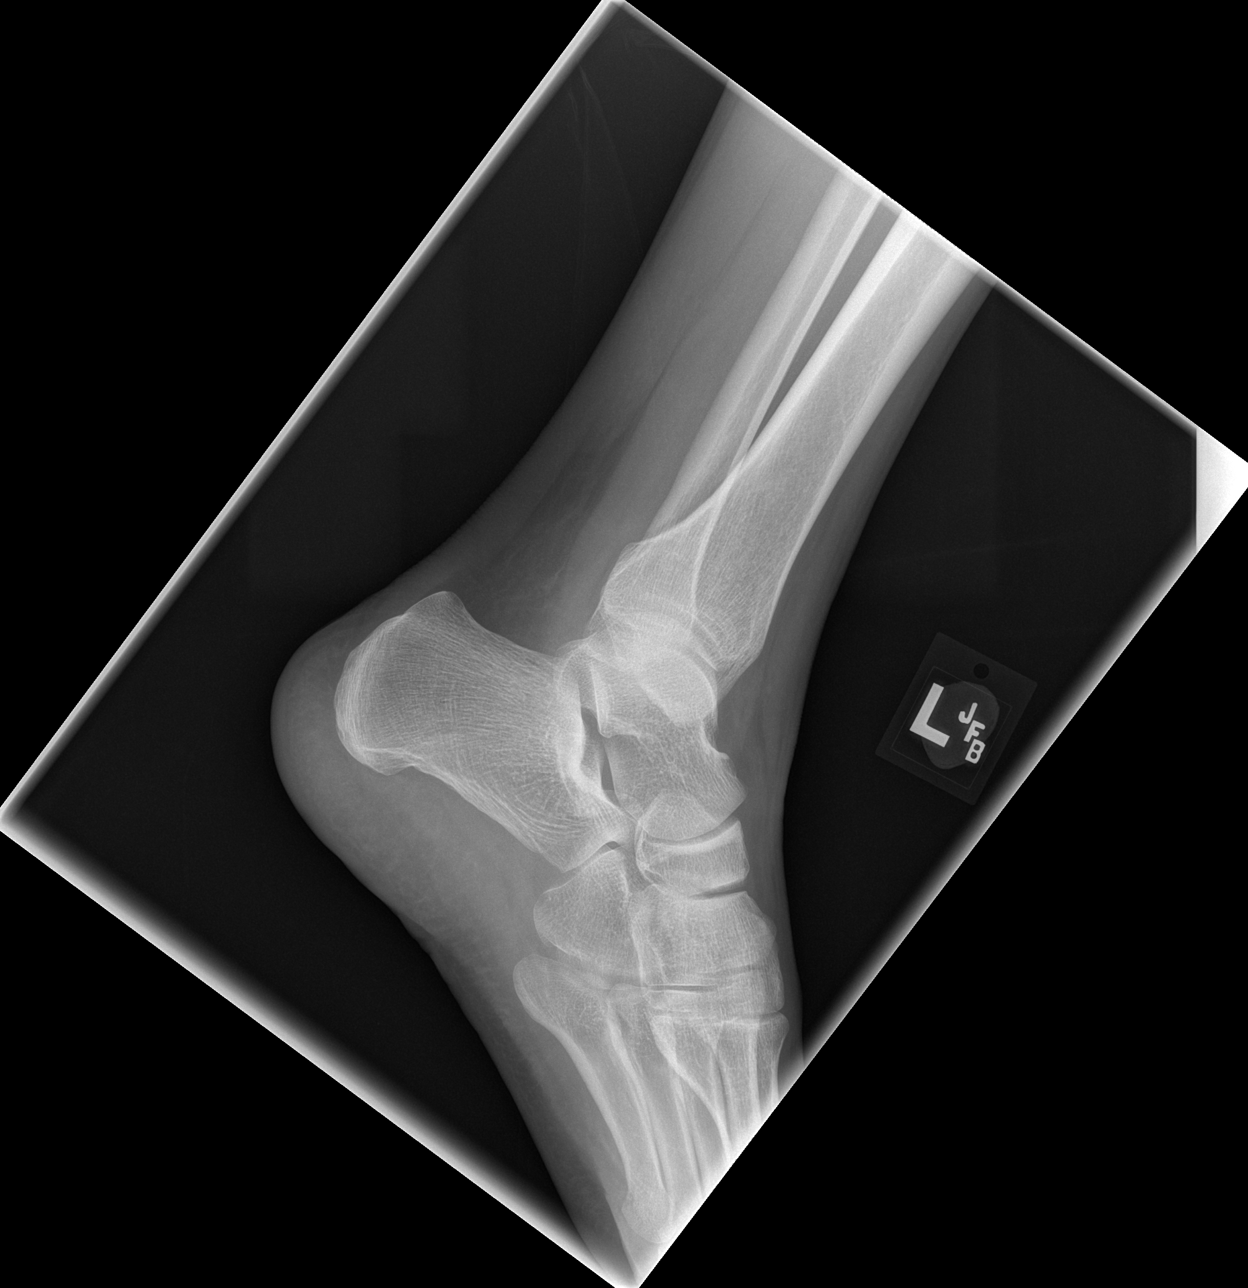

[3 of 3 positions shown; findings below may reference images not displayed]

FINDINGS: There is no evidence of fracture, dislocation, or joint effusion.
There is no evidence of arthropathy or other focal bone abnormality.
Soft tissues are unremarkable.
IMPRESSION: Negative.

## 2023-03-05 ENCOUNTER — Telehealth: Payer: Self-pay

## 2023-04-04 NOTE — Telephone Encounter (Signed)
error
